# Patient Record
Sex: Male | Born: 1970 | Race: White | Hispanic: No | Marital: Married | Smoking: Never smoker
Health system: Southern US, Community
[De-identification: ages and names within clinical notes are randomized; demographics above are authoritative.]

## PROBLEM LIST (undated history)

## (undated) DIAGNOSIS — R011 Cardiac murmur, unspecified: Secondary | ICD-10-CM

## (undated) DIAGNOSIS — I10 Essential (primary) hypertension: Secondary | ICD-10-CM

## (undated) HISTORY — PX: SHOULDER SURGERY: SHX246

## (undated) HISTORY — DX: Cardiac murmur, unspecified: R01.1

## (undated) HISTORY — PX: ANKLE SURGERY: SHX546

## (undated) HISTORY — PX: OTHER SURGICAL HISTORY: SHX169

## (undated) HISTORY — PX: ULNAR NERVE REPAIR: SHX2594

## (undated) HISTORY — DX: Essential (primary) hypertension: I10

---

## 1997-07-01 ENCOUNTER — Emergency Department (HOSPITAL_COMMUNITY): Admission: EM | Admit: 1997-07-01 | Discharge: 1997-07-01 | Payer: Self-pay | Admitting: Emergency Medicine

## 1997-07-06 ENCOUNTER — Encounter: Admission: RE | Admit: 1997-07-06 | Discharge: 1997-07-06 | Payer: Self-pay | Admitting: *Deleted

## 1997-07-12 ENCOUNTER — Encounter: Admission: RE | Admit: 1997-07-12 | Discharge: 1997-10-10 | Payer: Self-pay | Admitting: Internal Medicine

## 1999-03-24 ENCOUNTER — Encounter: Payer: Self-pay | Admitting: Internal Medicine

## 1999-03-24 ENCOUNTER — Ambulatory Visit (HOSPITAL_COMMUNITY): Admission: RE | Admit: 1999-03-24 | Discharge: 1999-03-24 | Payer: Self-pay | Admitting: Internal Medicine

## 1999-04-03 ENCOUNTER — Encounter: Payer: Self-pay | Admitting: Emergency Medicine

## 1999-04-03 ENCOUNTER — Emergency Department (HOSPITAL_COMMUNITY): Admission: EM | Admit: 1999-04-03 | Discharge: 1999-04-03 | Payer: Self-pay | Admitting: Emergency Medicine

## 2009-12-15 DIAGNOSIS — R739 Hyperglycemia, unspecified: Secondary | ICD-10-CM

## 2009-12-15 DIAGNOSIS — I10 Essential (primary) hypertension: Secondary | ICD-10-CM

## 2009-12-15 DIAGNOSIS — Q2381 Bicuspid aortic valve: Secondary | ICD-10-CM

## 2009-12-15 DIAGNOSIS — E7800 Pure hypercholesterolemia, unspecified: Secondary | ICD-10-CM | POA: Insufficient documentation

## 2009-12-15 DIAGNOSIS — G4733 Obstructive sleep apnea (adult) (pediatric): Secondary | ICD-10-CM

## 2009-12-15 DIAGNOSIS — M25511 Pain in right shoulder: Secondary | ICD-10-CM

## 2009-12-15 DIAGNOSIS — Q231 Congenital insufficiency of aortic valve: Secondary | ICD-10-CM

## 2009-12-15 DIAGNOSIS — M549 Dorsalgia, unspecified: Secondary | ICD-10-CM | POA: Insufficient documentation

## 2009-12-15 DIAGNOSIS — R0602 Shortness of breath: Secondary | ICD-10-CM

## 2009-12-15 DIAGNOSIS — G5620 Lesion of ulnar nerve, unspecified upper limb: Secondary | ICD-10-CM

## 2009-12-15 HISTORY — DX: Bicuspid aortic valve: Q23.81

## 2009-12-15 HISTORY — DX: Obstructive sleep apnea (adult) (pediatric): G47.33

## 2009-12-15 HISTORY — DX: Shortness of breath: R06.02

## 2009-12-15 HISTORY — DX: Congenital insufficiency of aortic valve: Q23.1

## 2009-12-15 HISTORY — DX: Pure hypercholesterolemia, unspecified: E78.00

## 2009-12-15 HISTORY — DX: Hyperglycemia, unspecified: R73.9

## 2009-12-15 HISTORY — DX: Dorsalgia, unspecified: M54.9

## 2009-12-15 HISTORY — DX: Lesion of ulnar nerve, unspecified upper limb: G56.20

## 2009-12-15 HISTORY — DX: Pain in right shoulder: M25.511

## 2009-12-15 HISTORY — DX: Essential (primary) hypertension: I10

## 2009-12-16 DIAGNOSIS — M7702 Medial epicondylitis, left elbow: Secondary | ICD-10-CM

## 2009-12-16 DIAGNOSIS — M25579 Pain in unspecified ankle and joints of unspecified foot: Secondary | ICD-10-CM

## 2009-12-16 DIAGNOSIS — Z0000 Encounter for general adult medical examination without abnormal findings: Secondary | ICD-10-CM | POA: Insufficient documentation

## 2009-12-16 HISTORY — DX: Medial epicondylitis, left elbow: M77.02

## 2009-12-16 HISTORY — DX: Pain in unspecified ankle and joints of unspecified foot: M25.579

## 2009-12-16 HISTORY — DX: Encounter for general adult medical examination without abnormal findings: Z00.00

## 2010-05-04 DIAGNOSIS — E349 Endocrine disorder, unspecified: Secondary | ICD-10-CM

## 2010-05-04 HISTORY — DX: Endocrine disorder, unspecified: E34.9

## 2010-08-21 DIAGNOSIS — Z833 Family history of diabetes mellitus: Secondary | ICD-10-CM

## 2010-08-21 DIAGNOSIS — Z8679 Personal history of other diseases of the circulatory system: Secondary | ICD-10-CM

## 2010-08-21 HISTORY — DX: Personal history of other diseases of the circulatory system: Z86.79

## 2010-08-21 HISTORY — DX: Family history of diabetes mellitus: Z83.3

## 2010-12-12 DIAGNOSIS — M5412 Radiculopathy, cervical region: Secondary | ICD-10-CM | POA: Insufficient documentation

## 2010-12-12 HISTORY — DX: Radiculopathy, cervical region: M54.12

## 2012-02-13 DIAGNOSIS — M519 Unspecified thoracic, thoracolumbar and lumbosacral intervertebral disc disorder: Secondary | ICD-10-CM

## 2012-02-13 HISTORY — DX: Unspecified thoracic, thoracolumbar and lumbosacral intervertebral disc disorder: M51.9

## 2012-09-11 DIAGNOSIS — F3342 Major depressive disorder, recurrent, in full remission: Secondary | ICD-10-CM

## 2012-09-11 HISTORY — DX: Major depressive disorder, recurrent, in full remission: F33.42

## 2013-02-12 DIAGNOSIS — K219 Gastro-esophageal reflux disease without esophagitis: Secondary | ICD-10-CM | POA: Insufficient documentation

## 2013-02-12 DIAGNOSIS — R1310 Dysphagia, unspecified: Secondary | ICD-10-CM

## 2013-02-12 HISTORY — DX: Dysphagia, unspecified: R13.10

## 2013-02-12 HISTORY — DX: Gastro-esophageal reflux disease without esophagitis: K21.9

## 2013-03-20 DIAGNOSIS — K229 Disease of esophagus, unspecified: Secondary | ICD-10-CM | POA: Insufficient documentation

## 2013-03-20 DIAGNOSIS — K2970 Gastritis, unspecified, without bleeding: Secondary | ICD-10-CM | POA: Insufficient documentation

## 2013-03-20 HISTORY — DX: Gastritis, unspecified, without bleeding: K29.70

## 2013-03-20 HISTORY — DX: Disease of esophagus, unspecified: K22.9

## 2015-10-18 DIAGNOSIS — M722 Plantar fascial fibromatosis: Secondary | ICD-10-CM

## 2015-10-18 DIAGNOSIS — B351 Tinea unguium: Secondary | ICD-10-CM | POA: Insufficient documentation

## 2015-10-18 HISTORY — DX: Plantar fascial fibromatosis: M72.2

## 2015-10-18 HISTORY — DX: Tinea unguium: B35.1

## 2015-12-02 DIAGNOSIS — M79671 Pain in right foot: Secondary | ICD-10-CM

## 2015-12-02 HISTORY — DX: Pain in right foot: M79.671

## 2018-08-19 ENCOUNTER — Telehealth: Payer: Self-pay

## 2018-08-19 NOTE — Telephone Encounter (Signed)
Questions for Screening COVID-19  Symptom onset:n/a  Travel or Contacts: no  During this illness, did/does the patient experience any of the following symptoms? Fever >100.4F []  Yes [x]  No []  Unknown Subjective fever (felt feverish) []  Yes [x]  No []  Unknown Chills []  Yes [x]  No []  Unknown Muscle aches (myalgia) []  Yes [x]  No []  Unknown Runny nose (rhinorrhea) []  Yes [x]  No []  Unknown Sore throat []  Yes [x]  No []  Unknown Cough (new onset or worsening of chronic cough) []  Yes [x]  No []  Unknown Shortness of breath (dyspnea) []  Yes [x]  No []  Unknown Nausea or vomiting []  Yes [x]  No []  Unknown Headache []  Yes [x]  No []  Unknown Abdominal pain  []  Yes [x]  No []  Unknown Diarrhea (?3 loose/looser than normal stools/24hr period) []  Yes [x]  No []  Unknown Other, specify:  Patient risk factors: Smoker? []  Current []  Former []  Never If male, currently pregnant? []  Yes []  No  There are no active problems to display for this patient.   Plan:  []  High risk for COVID-19 with red flags go to ED (with CP, SOB, weak/lightheaded, or fever > 101.5). Call ahead.  []  High risk for COVID-19 but stable. Inform provider and coordinate time for WEBEX visit.   []  No red flags but URI signs or symptoms okay for WEBEX visit.  

## 2018-08-20 ENCOUNTER — Ambulatory Visit (INDEPENDENT_AMBULATORY_CARE_PROVIDER_SITE_OTHER): Payer: BC Managed Care – PPO | Admitting: Family Medicine

## 2018-08-20 ENCOUNTER — Telehealth: Payer: Self-pay | Admitting: Gastroenterology

## 2018-08-20 ENCOUNTER — Encounter: Payer: Self-pay | Admitting: Family Medicine

## 2018-08-20 VITALS — BP 140/90 | HR 61 | Temp 98.7°F | Ht 73.0 in | Wt 273.0 lb

## 2018-08-20 DIAGNOSIS — Z1211 Encounter for screening for malignant neoplasm of colon: Secondary | ICD-10-CM

## 2018-08-20 DIAGNOSIS — I10 Essential (primary) hypertension: Secondary | ICD-10-CM

## 2018-08-20 DIAGNOSIS — Z0000 Encounter for general adult medical examination without abnormal findings: Secondary | ICD-10-CM

## 2018-08-20 DIAGNOSIS — Q2381 Bicuspid aortic valve: Secondary | ICD-10-CM

## 2018-08-20 DIAGNOSIS — E785 Hyperlipidemia, unspecified: Secondary | ICD-10-CM

## 2018-08-20 DIAGNOSIS — Q231 Congenital insufficiency of aortic valve: Secondary | ICD-10-CM

## 2018-08-20 DIAGNOSIS — M21961 Unspecified acquired deformity of right lower leg: Secondary | ICD-10-CM

## 2018-08-20 DIAGNOSIS — Z125 Encounter for screening for malignant neoplasm of prostate: Secondary | ICD-10-CM

## 2018-08-20 DIAGNOSIS — Z23 Encounter for immunization: Secondary | ICD-10-CM

## 2018-08-20 LAB — COMPREHENSIVE METABOLIC PANEL
ALT: 20 U/L (ref 0–53)
AST: 25 U/L (ref 0–37)
Albumin: 4.7 g/dL (ref 3.5–5.2)
Alkaline Phosphatase: 55 U/L (ref 39–117)
BUN: 23 mg/dL (ref 6–23)
CO2: 29 mEq/L (ref 19–32)
Calcium: 10.1 mg/dL (ref 8.4–10.5)
Chloride: 103 mEq/L (ref 96–112)
Creatinine, Ser: 1.27 mg/dL (ref 0.40–1.50)
GFR: 60.02 mL/min (ref >60.00)
Glucose, Bld: 98 mg/dL (ref 70–99)
Potassium: 4.8 mEq/L (ref 3.5–5.1)
Sodium: 138 mEq/L (ref 135–145)
Total Bilirubin: 0.5 mg/dL (ref 0.2–1.2)
Total Protein: 7.6 g/dL (ref 6.0–8.3)

## 2018-08-20 LAB — CBC WITH DIFFERENTIAL/PLATELET
Basophils Absolute: 0.1 10*3/uL (ref 0.0–0.1)
Basophils Relative: 1 % (ref 0.0–3.0)
Eosinophils Absolute: 0.2 10*3/uL (ref 0.0–0.7)
Eosinophils Relative: 3.9 % (ref 0.0–5.0)
HCT: 45.2 % (ref 39.0–52.0)
Hemoglobin: 14.9 g/dL (ref 13.0–17.0)
Lymphocytes Relative: 27.1 % (ref 12.0–46.0)
Lymphs Abs: 1.6 10*3/uL (ref 0.7–4.0)
MCHC: 33.1 g/dL (ref 30.0–36.0)
MCV: 89.6 fl (ref 78.0–100.0)
Monocytes Absolute: 0.3 10*3/uL (ref 0.1–1.0)
Monocytes Relative: 5.6 % (ref 3.0–12.0)
Neutro Abs: 3.6 10*3/uL (ref 1.4–7.7)
Neutrophils Relative %: 62.4 % (ref 43.0–77.0)
Platelets: 208 10*3/uL (ref 150.0–400.0)
RBC: 5.04 Mil/uL (ref 4.22–5.81)
RDW: 14.1 % (ref 11.5–15.5)
WBC: 5.8 10*3/uL (ref 4.0–10.5)

## 2018-08-20 LAB — LIPID PANEL
Cholesterol: 197 mg/dL (ref 0–200)
HDL: 48.6 mg/dL (ref >39.00)
LDL Cholesterol: 133 mg/dL — ABNORMAL HIGH (ref 0–99)
NonHDL: 148.78
Total CHOL/HDL Ratio: 4
Triglycerides: 79 mg/dL (ref 0.0–149.0)
VLDL: 15.8 mg/dL (ref 0.0–40.0)

## 2018-08-20 LAB — TSH: TSH: 1.32 u[IU]/mL (ref 0.35–4.50)

## 2018-08-20 LAB — PSA: PSA: 0.57 ng/mL (ref 0.10–4.00)

## 2018-08-20 MED ORDER — ATENOLOL 50 MG PO TABS: 50.0000 mg | ORAL_TABLET | Freq: Two times a day (BID) | ORAL | 1 refills | Status: DC

## 2018-08-20 MED ORDER — LISINOPRIL 20 MG PO TABS: 20.0000 mg | ORAL_TABLET | Freq: Every day | ORAL | 1 refills | Status: DC

## 2018-08-20 MED ORDER — ATENOLOL 50 MG PO TABS: 50.0000 mg | ORAL_TABLET | Freq: Every day | ORAL | 1 refills | Status: DC

## 2018-08-20 NOTE — Telephone Encounter (Signed)
DOD 8/19/202  Dr. Tarri Glenn  We have received a referral for a colonoscopy Pt has GI history in [place]  Records will be sent to you. Please review notes

## 2018-08-20 NOTE — Progress Notes (Signed)
Marcus Diaz - 48 y.o. male MRN [id number]  Date of birth: 05/27/1970  Subjective Chief Complaint  Patient presents with  . Establish Care    est care/ CPE- fasting/ need tdap and flu shot/ needs setting up for colonoscopy   . Medication Refill    atenolol, lisinopril    HPI Marcus Diaz is a 48 y.o. male with history of bicuspid aortic valve, HTN, and HLD here today for initial visit and CPE.  He recently moved the area from [place].  There are previous records from [facility] in [place], [place] available via [facility] that I reviewed today as well.    HTN/Bicuspid AV:  Current management with losartan and atenolol.  He reports he is doing well with current medication.  He denies symptoms of hypotension or bradycardia.  He has not had any increased shortness of breath, fatigue, for pre-syncopal symptoms.  Had Echo in 2018 showing no stenosis and mild thickening.    He also has concern of foot deformity of R foot.  Reports prior reconstruction of foot.  Area is bothersome and painful at times.   He remains very active, typically riding around 26 miles/weekend on his bike.  He does admit that diet could be improved some.  He is a lifelong non-smoker.  He is due for colon cancer screening.   Review of Systems  Constitutional: Negative for chills, fever, malaise/fatigue and weight loss.  HENT: Negative for congestion, ear pain and sore throat.   Eyes: Negative for blurred vision, double vision and pain.  Respiratory: Negative for cough and shortness of breath.   Cardiovascular: Negative for chest pain and palpitations.  Gastrointestinal: Negative for abdominal pain, blood in stool, constipation, heartburn and nausea.  Genitourinary: Negative for dysuria and urgency.  Musculoskeletal: Negative for joint pain and myalgias.  Neurological: Negative for dizziness and headaches.  Endo/Heme/Allergies: Does not bruise/bleed easily.  Psychiatric/Behavioral: Negative for depression. The  patient is not nervous/anxious and does not have insomnia.     No Known Allergies  Past Medical History:  Diagnosis Date  . Heart murmur   . Hypertension     Past Surgical History:  Procedure Laterality Date  . ANKLE SURGERY    . neck fused    . SHOULDER SURGERY    . ULNAR NERVE REPAIR      Social History   Socioeconomic History  . Marital status: Married    Spouse name: Not on file  . Number of children: Not on file  . Years of education: Not on file  . Highest education level: Not on file  Occupational History  . Not on file  Social Needs  . Financial resource strain: Not on file  . Food insecurity    Worry: Not on file    Inability: Not on file  . Transportation needs    Medical: Not on file    Non-medical: Not on file  Tobacco Use  . Smoking status: Never Smoker  . Smokeless tobacco: Never Used  Substance and Sexual Activity  . Alcohol use: Yes    Comment: social  . Drug use: Never  . Sexual activity: Not on file  Lifestyle  . Physical activity    Days per week: Not on file    Minutes per session: Not on file  . Stress: Not on file  Relationships  . Social Herbalist on phone: Not on file    Gets together: Not on file    Attends religious service:  Not on file    Active member of club or organization: Not on file    Attends meetings of clubs or organizations: Not on file    Relationship status: Not on file  Other Topics Concern  . Not on file  Social History Narrative  . Not on file    Family History  Problem Relation Age of Onset  . Lung disease Mother   . Cancer Father        esophageal cancer  . Cancer Maternal Grandfather        colon  . Cancer Paternal Grandmother        stomach    Health Maintenance  Topic Date Due  . HIV Screening  08/09/1983  . TETANUS/TDAP  08/09/1987  . COLONOSCOPY  08/09/2018  . INFLUENZA VACCINE  08/02/2018     ----------------------------------------------------------------------------------------------------------------------------------------------------------------------------------------------------------------- Physical Exam BP 140/90   Pulse 61   Temp 98.7 F (37.1 C) (Temporal)   Ht '6\' 1"'  (1.854 m)   Wt 273 lb (123.8 kg)   SpO2 98%   BMI 36.02 kg/m   Physical Exam Constitutional:      General: He is not in acute distress. HENT:     Head: Normocephalic and atraumatic.     Right Ear: External ear normal.     Left Ear: External ear normal.     Mouth/Throat:     Mouth: Mucous membranes are moist.  Eyes:     General: No scleral icterus. Neck:     Musculoskeletal: Normal range of motion.     Thyroid: No thyromegaly.  Cardiovascular:     Rate and Rhythm: Normal rate and regular rhythm.     Heart sounds: Normal heart sounds.  Pulmonary:     Effort: Pulmonary effort is normal.     Breath sounds: Normal breath sounds.  Abdominal:     General: Bowel sounds are normal. There is no distension.     Palpations: Abdomen is soft.     Tenderness: There is no abdominal tenderness. There is no guarding.  Musculoskeletal:     Comments: Deformity of lateral portion of R foot with overlying callus.   Lymphadenopathy:     Cervical: No cervical adenopathy.  Skin:    General: Skin is warm and dry.     Findings: No rash.  Neurological:     Mental Status: He is alert and oriented to person, place, and time.     Cranial Nerves: No cranial nerve deficit.     Motor: No abnormal muscle tone.  Psychiatric:        Behavior: Behavior normal.     ------------------------------------------------------------------------------------------------------------------------------------------------------------------------------------------------------------------- Assessment and Plan  Bicuspid aortic valve -Stable without symptoms, Echo in 2018 stable.   Essential hypertension -Currently only taking  atenolol qday, will have him increase back to BID.  -Continue lisinopril.   Foot deformity, acquired, right Referral to podiatry  Well adult exam Orders Placed This Encounter  Procedures  . Flu Vaccine QUAD 6+ mos PF IM (Fluarix Quad PF)  . Tdap vaccine greater than or equal to 7yo IM  . Comp Met (CMET)  . CBC w/Diff  . Lipid Profile  . TSH  . PSA  . Ambulatory referral to Gastroenterology    Referral Priority:   Routine    Referral Type:   Consultation    Referral Reason:   Specialty Services Required    Number of Visits Requested:   1  . Ambulatory referral to Podiatry    Referral Priority:   Routine  Referral Type:   Consultation    Referral Reason:   Specialty Services Required    Requested Specialty:   Podiatry    Number of Visits Requested:   1   Screenings: PSA, referral for colonscopy -Immunizations: Tdap and influenza -Anticipatory guidance/Risk factor reduction:  Recommendations per AVS

## 2018-08-20 NOTE — Assessment & Plan Note (Signed)
Referral to podiatry  

## 2018-08-20 NOTE — Assessment & Plan Note (Signed)
-  Stable without symptoms, Echo in 2018 stable.

## 2018-08-20 NOTE — Patient Instructions (Signed)
Preventive Care 40-50 Years Old, Male Preventive care refers to lifestyle choices and visits with your health care provider that can promote health and wellness. This includes:  A yearly physical exam. This is also called an annual well check.  Regular dental and eye exams.  Immunizations.  Screening for certain conditions.  Healthy lifestyle choices, such as eating a healthy diet, getting regular exercise, not using drugs or products that contain nicotine and tobacco, and limiting alcohol use. What can I expect for my preventive care visit? Physical exam Your health care provider will check:  Height and weight. These may be used to calculate body mass index (BMI), which is a measurement that tells if you are at a healthy weight.  Heart rate and blood pressure.  Your skin for abnormal spots. Counseling Your health care provider may ask you questions about:  Alcohol, tobacco, and drug use.  Emotional well-being.  Home and relationship well-being.  Sexual activity.  Eating habits.  Work and work environment. What immunizations do I need?  Influenza (flu) vaccine  This is recommended every year. Tetanus, diphtheria, and pertussis (Tdap) vaccine  You may need a Td booster every 10 years. Varicella (chickenpox) vaccine  You may need this vaccine if you have not already been vaccinated. Zoster (shingles) vaccine  You may need this after age 60. Measles, mumps, and rubella (MMR) vaccine  You may need at least one dose of MMR if you were born in 1957 or later. You may also need a second dose. Pneumococcal conjugate (PCV13) vaccine  You may need this if you have certain conditions and were not previously vaccinated. Pneumococcal polysaccharide (PPSV23) vaccine  You may need one or two doses if you smoke cigarettes or if you have certain conditions. Meningococcal conjugate (MenACWY) vaccine  You may need this if you have certain conditions. Hepatitis A vaccine   You may need this if you have certain conditions or if you travel or work in places where you may be exposed to hepatitis A. Hepatitis B vaccine  You may need this if you have certain conditions or if you travel or work in places where you may be exposed to hepatitis B. Haemophilus influenzae type b (Hib) vaccine  You may need this if you have certain risk factors. Human papillomavirus (HPV) vaccine  If recommended by your health care provider, you may need three doses over 6 months. You may receive vaccines as individual doses or as more than one vaccine together in one shot (combination vaccines). Talk with your health care provider about the risks and benefits of combination vaccines. What tests do I need? Blood tests  Lipid and cholesterol levels. These may be checked every 5 years, or more frequently if you are over 50 years old.  Hepatitis C test.  Hepatitis B test. Screening  Lung cancer screening. You may have this screening every year starting at age 55 if you have a 30-pack-year history of smoking and currently smoke or have quit within the past 15 years.  Prostate cancer screening. Recommendations will vary depending on your family history and other risks.  Colorectal cancer screening. All adults should have this screening starting at age 50 and continuing until age 75. Your health care provider may recommend screening at age 45 if you are at increased risk. You will have tests every 1-10 years, depending on your results and the type of screening test.  Diabetes screening. This is done by checking your blood sugar (glucose) after you have not eaten   for a while (fasting). You may have this done every 1-3 years.  Sexually transmitted disease (STD) testing. Follow these instructions at home: Eating and drinking  Eat a diet that includes fresh fruits and vegetables, whole grains, lean protein, and low-fat dairy products.  Take vitamin and mineral supplements as recommended  by your health care provider.  Do not drink alcohol if your health care provider tells you not to drink.  If you drink alcohol: ? Limit how much you have to 0-2 drinks a day. ? Be aware of how much alcohol is in your drink. In the [place] one drink equals one 12 oz bottle of beer (355 mL), one 5 oz glass of wine (148 mL), or one 1 oz glass of hard liquor (44 mL). Lifestyle  Take daily care of your teeth and gums.  Stay active. Exercise for at least 30 minutes on 5 or more days each week.  Do not use any products that contain nicotine or tobacco, such as cigarettes, e-cigarettes, and chewing tobacco. If you need help quitting, ask your health care provider.  If you are sexually active, practice safe sex. Use a condom or other form of protection to prevent STIs (sexually transmitted infections).  Talk with your health care provider about taking a low-dose aspirin every day starting at age 47. What's next?  Go to your health care provider once a year for a well check visit.  Ask your health care provider how often you should have your eyes and teeth checked.  Stay up to date on all vaccines. This information is not intended to replace advice given to you by your health care provider. Make sure you discuss any questions you have with your health care provider. Document Released: 01/14/2015 Document Revised: 12/12/2017 Document Reviewed: 12/12/2017 Elsevier Patient Education  2020 [employer].

## 2018-08-20 NOTE — Assessment & Plan Note (Signed)
Orders Placed This Encounter  Procedures  . Flu Vaccine QUAD 6+ mos PF IM (Fluarix Quad PF)  . Tdap vaccine greater than or equal to 50yo IM  . Comp Met (CMET)  . CBC w/Diff  . Lipid Profile  . TSH  . PSA  . Ambulatory referral to Gastroenterology    Referral Priority:   Routine    Referral Type:   Consultation    Referral Reason:   Specialty Services Required    Number of Visits Requested:   1  . Ambulatory referral to Podiatry    Referral Priority:   Routine    Referral Type:   Consultation    Referral Reason:   Specialty Services Required    Requested Specialty:   Podiatry    Number of Visits Requested:   1   Screenings: PSA, referral for colonscopy -Immunizations: Tdap and influenza -Anticipatory guidance/Risk factor reduction:  Recommendations per AVS

## 2018-08-20 NOTE — Assessment & Plan Note (Signed)
-  Currently only taking atenolol qday, will have him increase back to BID.  -Continue lisinopril.

## 2018-09-24 ENCOUNTER — Encounter: Payer: BC Managed Care – PPO | Admitting: Gastroenterology

## 2018-10-24 ENCOUNTER — Encounter: Payer: Self-pay | Admitting: Family Medicine

## 2018-11-06 ENCOUNTER — Encounter: Payer: Self-pay | Admitting: Gastroenterology

## 2018-12-17 NOTE — Telephone Encounter (Signed)
Message was left 09/24/18.  Records filed in drawer.

## 2019-04-20 ENCOUNTER — Ambulatory Visit (INDEPENDENT_AMBULATORY_CARE_PROVIDER_SITE_OTHER): Payer: Medicare Other | Admitting: Family Medicine

## 2019-04-20 ENCOUNTER — Encounter: Payer: Self-pay | Admitting: Family Medicine

## 2019-04-20 ENCOUNTER — Other Ambulatory Visit: Payer: Self-pay

## 2019-04-20 VITALS — BP 130/90 | HR 57 | Temp 97.8°F | Ht 73.0 in | Wt 279.0 lb

## 2019-04-20 DIAGNOSIS — I10 Essential (primary) hypertension: Secondary | ICD-10-CM

## 2019-04-20 DIAGNOSIS — T887XXA Unspecified adverse effect of drug or medicament, initial encounter: Secondary | ICD-10-CM

## 2019-04-20 DIAGNOSIS — R001 Bradycardia, unspecified: Secondary | ICD-10-CM

## 2019-04-20 DIAGNOSIS — N5201 Erectile dysfunction due to arterial insufficiency: Secondary | ICD-10-CM

## 2019-04-20 DIAGNOSIS — N529 Male erectile dysfunction, unspecified: Secondary | ICD-10-CM | POA: Insufficient documentation

## 2019-04-20 HISTORY — DX: Erectile dysfunction due to arterial insufficiency: N52.01

## 2019-04-20 MED ORDER — LOSARTAN POTASSIUM 100 MG PO TABS: 100.0000 mg | ORAL_TABLET | Freq: Every day | ORAL | 0 refills | Status: DC

## 2019-04-20 MED ORDER — TADALAFIL 20 MG PO TABS: 10.0000 mg | ORAL_TABLET | ORAL | 11 refills | Status: AC | PRN

## 2019-04-20 MED ORDER — ATENOLOL 50 MG PO TABS: ORAL_TABLET | ORAL | 1 refills | Status: DC

## 2019-04-20 NOTE — Progress Notes (Signed)
Established Patient Office Visit  Subjective:  Patient ID: Marcus Heinecke., male    DOB: [birth year]  Age: 49 y.o. MRN: [id number]  CC:  Chief Complaint  Patient presents with  . Establish Care    Medication refill    HPI Marcus K Mcmahen Jr. presents for follow-up of his hypertension and erectile dysfunction.  Has been on atenolol and lisinopril for years.  Complains of low heart rate and difficulty achieving heart rate when exercising.  He has noticed intermittent cough for the period of time he has been taking the lisinopril.  Complains of intermittent ED.  Is worse after he rides his bike for any length of time but can occur randomly as well.  Past Medical History:  Diagnosis Date  . Heart murmur   . Hypertension     Past Surgical History:  Procedure Laterality Date  . ANKLE SURGERY    . neck fused    . SHOULDER SURGERY    . ULNAR NERVE REPAIR      Family History  Problem Relation Age of Onset  . Lung disease Mother   . Cancer Father        esophageal cancer  . Cancer Maternal Grandfather        colon  . Cancer Paternal Grandmother        stomach    Social History   Socioeconomic History  . Marital status: Married    Spouse name: Not on file  . Number of children: Not on file  . Years of education: Not on file  . Highest education level: Not on file  Occupational History  . Not on file  Tobacco Use  . Smoking status: Never Smoker  . Smokeless tobacco: Never Used  Substance and Sexual Activity  . Alcohol use: Yes    Comment: social  . Drug use: Never  . Sexual activity: Not on file  Other Topics Concern  . Not on file  Social History Narrative  . Not on file   Social Determinants of Health   Financial Resource Strain:   . Difficulty of Paying Living Expenses:   Food Insecurity:   . Worried About Programme researcher, broadcasting/film/video in the Last Year:   . Barista in the Last Year:   Transportation Needs:   . Freight forwarder (Medical):   Marland Kitchen Lack  of Transportation (Non-Medical):   Physical Activity:   . Days of Exercise per Week:   . Minutes of Exercise per Session:   Stress:   . Feeling of Stress :   Social Connections:   . Frequency of Communication with Friends and Family:   . Frequency of Social Gatherings with Friends and Family:   . Attends Religious Services:   . Active Member of Clubs or Organizations:   . Attends Banker Meetings:   Marland Kitchen Marital Status:   Intimate Partner Violence:   . Fear of Current or Ex-Partner:   . Emotionally Abused:   Marland Kitchen Physically Abused:   . Sexually Abused:     Outpatient Medications Prior to Visit  Medication Sig Dispense Refill  . esomeprazole (NEXIUM) 20 MG capsule Take 20 mg by mouth daily at 12 noon.    Marland Kitchen atenolol (TENORMIN) 50 MG tablet Take 1 tablet (50 mg total) by mouth 2 (two) times daily. 180 tablet 1  . lisinopril (ZESTRIL) 20 MG tablet Take 1 tablet (20 mg total) by mouth daily. 90 tablet 1   No facility-administered medications  prior to visit.    No Known Allergies  ROS Review of Systems  Constitutional: Negative.  Negative for diaphoresis, fatigue, fever and unexpected weight change.  HENT: Negative.   Eyes: Negative for photophobia and visual disturbance.  Respiratory: Positive for cough. Negative for shortness of breath and wheezing.   Cardiovascular: Negative.  Negative for chest pain and palpitations.  Gastrointestinal: Negative.   Endocrine: Negative for polyphagia and polyuria.  Genitourinary: Negative.   Musculoskeletal: Negative.   Neurological: Negative.   Psychiatric/Behavioral: Negative.       Objective:    Physical Exam  Constitutional: He is oriented to person, place, and time. He appears well-developed and well-nourished. No distress.  HENT:  Head: Normocephalic and atraumatic.  Right Ear: External ear normal.  Left Ear: External ear normal.  Eyes: Conjunctivae are normal. Right eye exhibits no discharge. Left eye exhibits no  discharge. No scleral icterus.  Neck: No JVD present. No tracheal deviation present. No thyromegaly present.  Cardiovascular: Normal rate, regular rhythm and normal heart sounds.  Pulmonary/Chest: Effort normal and breath sounds normal. No stridor.  Abdominal: Bowel sounds are normal.  Musculoskeletal:        General: No edema.  Neurological: He is alert and oriented to person, place, and time.  Skin: Skin is warm and dry. He is not diaphoretic.  Psychiatric: He has a normal mood and affect. His behavior is normal.    BP 130/90 (BP Location: Left Arm, Patient Position: Sitting, Cuff Size: Large)   Pulse (!) 57   Temp 97.8 F (36.6 C) (Temporal)   Ht 6\' 1"  (1.854 m)   Wt 279 lb (126.6 kg)   SpO2 98%   BMI 36.81 kg/m  Wt Readings from Last 3 Encounters:  04/20/19 279 lb (126.6 kg)  08/20/18 273 lb (123.8 kg)     Health Maintenance Due  Topic Date Due  . HIV Screening  Never done  . COVID-19 Vaccine (1) Never done  . COLONOSCOPY  Never done    There are no preventive care reminders to display for this patient.  Lab Results  Component Value Date   TSH 1.32 08/20/2018   Lab Results  Component Value Date   WBC 5.8 08/20/2018   HGB 14.9 08/20/2018   HCT 45.2 08/20/2018   MCV 89.6 08/20/2018   PLT 208.0 08/20/2018   Lab Results  Component Value Date   NA 138 08/20/2018   K 4.8 08/20/2018   CO2 29 08/20/2018   GLUCOSE 98 08/20/2018   BUN 23 08/20/2018   CREATININE 1.27 08/20/2018   BILITOT 0.5 08/20/2018   ALKPHOS 55 08/20/2018   AST 25 08/20/2018   ALT 20 08/20/2018   PROT 7.6 08/20/2018   ALBUMIN 4.7 08/20/2018   CALCIUM 10.1 08/20/2018   GFR 60.02 08/20/2018   Lab Results  Component Value Date   CHOL 197 08/20/2018   Lab Results  Component Value Date   HDL 48.60 08/20/2018   Lab Results  Component Value Date   LDLCALC 133 (H) 08/20/2018   Lab Results  Component Value Date   TRIG 79.0 08/20/2018   Lab Results  Component Value Date   CHOLHDL  4 08/20/2018   No results found for: HGBA1C    Assessment & Plan:   Problem List Items Addressed This Visit      Cardiovascular and Mediastinum   Essential hypertension - Primary   Relevant Medications   atenolol (TENORMIN) 50 MG tablet   losartan (COZAAR) 100 MG tablet  tadalafil (CIALIS) 20 MG tablet   Erectile dysfunction due to arterial insufficiency   Relevant Medications   atenolol (TENORMIN) 50 MG tablet   losartan (COZAAR) 100 MG tablet   tadalafil (CIALIS) 20 MG tablet     Other   Bradycardia    Other Visit Diagnoses    Medication side effect       Relevant Medications   atenolol (TENORMIN) 50 MG tablet      Meds ordered this encounter  Medications  . atenolol (TENORMIN) 50 MG tablet    Sig: Take one daily for one week and then one every other day for a week and then stop.    Dispense:  14 tablet    Refill:  1    To replace previous rx for once daily  . losartan (COZAAR) 100 MG tablet    Sig: Take 1 tablet (100 mg total) by mouth daily.    Dispense:  90 tablet    Refill:  0  . tadalafil (CIALIS) 20 MG tablet    Sig: Take 0.5-1 tablets (10-20 mg total) by mouth every other day as needed for erectile dysfunction.    Dispense:  5 tablet    Refill:  11    Follow-up: Return in about 5 weeks (around 05/25/2019).   Discontinue lisinopril now.  Start losartan.  Will taper Tenormin over 2 weeks.  Check and record blood pressures and follow-up in 5 weeks. Libby Maw, MD

## 2019-05-25 ENCOUNTER — Encounter: Payer: Self-pay | Admitting: Family Medicine

## 2019-05-25 ENCOUNTER — Other Ambulatory Visit: Payer: Self-pay

## 2019-05-25 ENCOUNTER — Ambulatory Visit: Payer: Medicare Other | Admitting: Family Medicine

## 2019-05-25 VITALS — BP 144/80 | HR 74 | Temp 96.9°F | Ht 73.0 in | Wt 278.4 lb

## 2019-05-25 DIAGNOSIS — H6982 Other specified disorders of Eustachian tube, left ear: Secondary | ICD-10-CM | POA: Diagnosis not present

## 2019-05-25 DIAGNOSIS — I10 Essential (primary) hypertension: Secondary | ICD-10-CM

## 2019-05-25 DIAGNOSIS — H6992 Unspecified Eustachian tube disorder, left ear: Secondary | ICD-10-CM

## 2019-05-25 HISTORY — DX: Unspecified Eustachian tube disorder, left ear: H69.92

## 2019-05-25 HISTORY — DX: Other specified disorders of eustachian tube, left ear: H69.82

## 2019-05-25 LAB — BASIC METABOLIC PANEL
BUN: 19 mg/dL (ref 6–23)
CO2: 30 mEq/L (ref 19–32)
Calcium: 9.7 mg/dL (ref 8.4–10.5)
Chloride: 103 mEq/L (ref 96–112)
Creatinine, Ser: 1.17 mg/dL (ref 0.40–1.50)
GFR: 65.78 mL/min (ref >60.00)
Glucose, Bld: 112 mg/dL — ABNORMAL HIGH (ref 70–99)
Potassium: 4.1 mEq/L (ref 3.5–5.1)
Sodium: 140 mEq/L (ref 135–145)

## 2019-05-25 MED ORDER — MOMETASONE FUROATE 50 MCG/ACT NA SUSP: 2.0000 | Freq: Every day | NASAL | 12 refills | Status: DC

## 2019-05-25 MED ORDER — CHLORTHALIDONE 25 MG PO TABS: 25.0000 mg | ORAL_TABLET | Freq: Every day | ORAL | 0 refills | Status: DC

## 2019-05-25 NOTE — Addendum Note (Signed)
Addended by: Andrez Grime on: 05/25/2019 10:01 AM   Modules accepted: Orders

## 2019-05-25 NOTE — Progress Notes (Addendum)
Established Patient Office Visit  Subjective:  Patient ID: Marcus Diaz., male    DOB: [birth year]  Age: 49 y.o. MRN: [id number]  CC:  Chief Complaint  Patient presents with  . Follow-up    5 week follow up on BP, no concerns. Patient would like left ear checked for wax build up.     HPI [employer]. presents for follow-up of his blood pressure and bradycardia status post discontinuation of Tenormin.  Heart rate is now responding to exercise.  He is no longer bradycardic.  Cough is resolved with discontinue attenuation of the lisinopril.  Left ear is stopped up.  Is unaware of drainage.  Has experienced left ear congestion for years.  Past Medical History:  Diagnosis Date  . Heart murmur   . Hypertension     Past Surgical History:  Procedure Laterality Date  . ANKLE SURGERY    . neck fused    . SHOULDER SURGERY    . ULNAR NERVE REPAIR      Family History  Problem Relation Age of Onset  . Lung disease Mother   . Cancer Father        esophageal cancer  . Cancer Maternal Grandfather        colon  . Cancer Paternal Grandmother        stomach    Social History   Socioeconomic History  . Marital status: Married    Spouse name: Not on file  . Number of children: Not on file  . Years of education: Not on file  . Highest education level: Not on file  Occupational History  . Not on file  Tobacco Use  . Smoking status: Never Smoker  . Smokeless tobacco: Never Used  Substance and Sexual Activity  . Alcohol use: Yes    Comment: social  . Drug use: Never  . Sexual activity: Not on file  Other Topics Concern  . Not on file  Social History Narrative  . Not on file   Social Determinants of Health   Financial Resource Strain:   . Difficulty of Paying Living Expenses:   Food Insecurity:   . Worried About Charity fundraiser in the Last Year:   . Arboriculturist in the Last Year:   Transportation Needs:   . Film/video editor (Medical):   Marland Kitchen Lack of  Transportation (Non-Medical):   Physical Activity:   . Days of Exercise per Week:   . Minutes of Exercise per Session:   Stress:   . Feeling of Stress :   Social Connections:   . Frequency of Communication with Friends and Family:   . Frequency of Social Gatherings with Friends and Family:   . Attends Religious Services:   . Active Member of Clubs or Organizations:   . Attends Archivist Meetings:   Marland Kitchen Marital Status:   Intimate Partner Violence:   . Fear of Current or Ex-Partner:   . Emotionally Abused:   Marland Kitchen Physically Abused:   . Sexually Abused:     Outpatient Medications Prior to Visit  Medication Sig Dispense Refill  . esomeprazole (NEXIUM) 20 MG capsule Take 20 mg by mouth daily at 12 noon.    Marland Kitchen losartan (COZAAR) 100 MG tablet Take 1 tablet (100 mg total) by mouth daily. 90 tablet 0  . tadalafil (CIALIS) 20 MG tablet Take 0.5-1 tablets (10-20 mg total) by mouth every other day as needed for erectile dysfunction. 5 tablet 11  .  atenolol (TENORMIN) 50 MG tablet Take one daily for one week and then one every other day for a week and then stop. (Patient not taking: Reported on 05/25/2019) 14 tablet 1   No facility-administered medications prior to visit.    No Known Allergies  ROS Review of Systems  Constitutional: Negative.   HENT: Positive for hearing loss.   Eyes: Negative for photophobia.  Respiratory: Negative for cough, choking and shortness of breath.   Cardiovascular: Negative.  Negative for palpitations.  Gastrointestinal: Negative.   Genitourinary: Negative.   Musculoskeletal: Negative for gait problem and joint swelling.  Allergic/Immunologic: Negative for immunocompromised state.  Neurological: Negative.   Hematological: Does not bruise/bleed easily.  Psychiatric/Behavioral: Negative.       Objective:    Physical Exam  Constitutional: He is oriented to person, place, and time. He appears well-developed and well-nourished. No distress.  HENT:    Head: Normocephalic and atraumatic.  Right Ear: External ear normal.  Left Ear: External ear normal.  Mouth/Throat: Oropharynx is clear and moist. No oropharyngeal exudate.  Eyes: Pupils are equal, round, and reactive to light. Conjunctivae are normal. Right eye exhibits no discharge. Left eye exhibits no discharge.  Neck: No JVD present. No tracheal deviation present. No thyromegaly present.  Cardiovascular: Normal rate, regular rhythm and normal heart sounds.  Pulmonary/Chest: Effort normal and breath sounds normal. No stridor.  Abdominal: Bowel sounds are normal.  Musculoskeletal:        General: No edema.  Lymphadenopathy:    He has no cervical adenopathy.  Neurological: He is alert and oriented to person, place, and time.  Skin: Skin is warm and dry. He is not diaphoretic.  Psychiatric: He has a normal mood and affect. His behavior is normal.    BP (!) 144/80   Pulse 74   Temp (!) 96.9 F (36.1 C) (Tympanic)   Ht 6\' 1"  (1.854 m)   Wt 278 lb 6.4 oz (126.3 kg)   SpO2 94%   BMI 36.73 kg/m  Wt Readings from Last 3 Encounters:  05/25/19 278 lb 6.4 oz (126.3 kg)  04/20/19 279 lb (126.6 kg)  08/20/18 273 lb (123.8 kg)     Health Maintenance Due  Topic Date Due  . HIV Screening  Never done  . COLONOSCOPY  Never done    There are no preventive care reminders to display for this patient.  Lab Results  Component Value Date   TSH 1.32 08/20/2018   Lab Results  Component Value Date   WBC 5.8 08/20/2018   HGB 14.9 08/20/2018   HCT 45.2 08/20/2018   MCV 89.6 08/20/2018   PLT 208.0 08/20/2018   Lab Results  Component Value Date   NA 138 08/20/2018   K 4.8 08/20/2018   CO2 29 08/20/2018   GLUCOSE 98 08/20/2018   BUN 23 08/20/2018   CREATININE 1.27 08/20/2018   BILITOT 0.5 08/20/2018   ALKPHOS 55 08/20/2018   AST 25 08/20/2018   ALT 20 08/20/2018   PROT 7.6 08/20/2018   ALBUMIN 4.7 08/20/2018   CALCIUM 10.1 08/20/2018   GFR 60.02 08/20/2018   Lab Results   Component Value Date   CHOL 197 08/20/2018   Lab Results  Component Value Date   HDL 48.60 08/20/2018   Lab Results  Component Value Date   LDLCALC 133 (H) 08/20/2018   Lab Results  Component Value Date   TRIG 79.0 08/20/2018   Lab Results  Component Value Date   CHOLHDL 4 08/20/2018  No results found for: HGBA1C    Assessment & Plan:   Problem List Items Addressed This Visit      Cardiovascular and Mediastinum   Essential hypertension - Primary   Relevant Medications   chlorthalidone (HYGROTON) 25 MG tablet   Other Relevant Orders   Basic metabolic panel     Nervous and Auditory   Dysfunction of left eustachian tube   Relevant Medications   mometasone (NASONEX) 50 MCG/ACT nasal spray      Meds ordered this encounter  Medications  . chlorthalidone (HYGROTON) 25 MG tablet    Sig: Take 1 tablet (25 mg total) by mouth daily.    Dispense:  90 tablet    Refill:  0  . mometasone (NASONEX) 50 MCG/ACT nasal spray    Sig: Place 2 sprays into the nose daily.    Dispense:  17 g    Refill:  12    Follow-up: Return in about 1 month (around 06/25/2019).   Have added chlorthalidone.  Warned of initial ED issues.  Will try Nasonex for his left ear congestion.  May need ENT referral. Mliss Sax, MD

## 2019-06-24 ENCOUNTER — Other Ambulatory Visit: Payer: Self-pay

## 2019-06-25 ENCOUNTER — Encounter: Payer: Self-pay | Admitting: Family Medicine

## 2019-06-25 ENCOUNTER — Ambulatory Visit: Payer: Medicare Other | Admitting: Family Medicine

## 2019-06-25 VITALS — BP 146/78 | HR 78 | Temp 98.0°F | Ht 73.0 in | Wt 270.6 lb

## 2019-06-25 DIAGNOSIS — H6982 Other specified disorders of Eustachian tube, left ear: Secondary | ICD-10-CM | POA: Diagnosis not present

## 2019-06-25 DIAGNOSIS — L639 Alopecia areata, unspecified: Secondary | ICD-10-CM

## 2019-06-25 DIAGNOSIS — Q231 Congenital insufficiency of aortic valve: Secondary | ICD-10-CM | POA: Diagnosis not present

## 2019-06-25 DIAGNOSIS — Z0000 Encounter for general adult medical examination without abnormal findings: Secondary | ICD-10-CM

## 2019-06-25 DIAGNOSIS — I10 Essential (primary) hypertension: Secondary | ICD-10-CM | POA: Diagnosis not present

## 2019-06-25 HISTORY — DX: Morbid (severe) obesity due to excess calories: E66.01

## 2019-06-25 LAB — URINALYSIS, ROUTINE W REFLEX MICROSCOPIC
Bilirubin Urine: NEGATIVE
Hgb urine dipstick: NEGATIVE
Ketones, ur: NEGATIVE
Leukocytes,Ua: NEGATIVE
Nitrite: NEGATIVE
RBC / HPF: NONE SEEN (ref 0–?)
Specific Gravity, Urine: 1.015 (ref 1.000–1.030)
Total Protein, Urine: NEGATIVE
Urine Glucose: NEGATIVE
Urobilinogen, UA: 0.2 (ref 0.0–1.0)
WBC, UA: NONE SEEN (ref 0–?)
pH: 7.5 (ref 5.0–8.0)

## 2019-06-25 LAB — COMPREHENSIVE METABOLIC PANEL
ALT: 47 U/L (ref 0–53)
AST: 40 U/L — ABNORMAL HIGH (ref 0–37)
Albumin: 4.6 g/dL (ref 3.5–5.2)
Alkaline Phosphatase: 59 U/L (ref 39–117)
BUN: 20 mg/dL (ref 6–23)
CO2: 32 mEq/L (ref 19–32)
Calcium: 10 mg/dL (ref 8.4–10.5)
Chloride: 98 mEq/L (ref 96–112)
Creatinine, Ser: 1.16 mg/dL (ref 0.40–1.50)
GFR: 66.41 mL/min (ref >60.00)
Glucose, Bld: 98 mg/dL (ref 70–99)
Potassium: 4.2 mEq/L (ref 3.5–5.1)
Sodium: 137 mEq/L (ref 135–145)
Total Bilirubin: 0.5 mg/dL (ref 0.2–1.2)
Total Protein: 7.4 g/dL (ref 6.0–8.3)

## 2019-06-25 LAB — LDL CHOLESTEROL, DIRECT: Direct LDL: 145 mg/dL

## 2019-06-25 LAB — CBC
HCT: 45 % (ref 39.0–52.0)
Hemoglobin: 15.1 g/dL (ref 13.0–17.0)
MCHC: 33.6 g/dL (ref 30.0–36.0)
MCV: 88.5 fl (ref 78.0–100.0)
Platelets: 230 10*3/uL (ref 150.0–400.0)
RBC: 5.08 Mil/uL (ref 4.22–5.81)
RDW: 13.5 % (ref 11.5–15.5)
WBC: 5.8 10*3/uL (ref 4.0–10.5)

## 2019-06-25 LAB — PSA: PSA: 0.52 ng/mL (ref 0.10–4.00)

## 2019-06-25 MED ORDER — LOSARTAN POTASSIUM 100 MG PO TABS: 100.0000 mg | ORAL_TABLET | Freq: Every day | ORAL | 0 refills | Status: DC

## 2019-06-25 MED ORDER — AMLODIPINE BESYLATE 5 MG PO TABS: 5.0000 mg | ORAL_TABLET | Freq: Every day | ORAL | 3 refills | Status: AC

## 2019-06-25 MED ORDER — CHLORTHALIDONE 25 MG PO TABS: 25.0000 mg | ORAL_TABLET | Freq: Every day | ORAL | 0 refills | Status: DC

## 2019-06-25 NOTE — Progress Notes (Signed)
Established Patient Office Visit  Subjective:  Patient ID: Marcus Diaz., male    DOB: [birth year]  Age: 49 y.o. MRN: [id number]  CC:  Chief Complaint  Patient presents with  . Follow-up    1 month follow up on BP, no concerns    HPI Marcus Diaz. presents for follow-up of blood pressure.  Is tapered off of Tenormin.  Bradycardia has resolved.  Having no issues taking chlorthalidone or losartan.  Blood pressure remains elevated.  Nonfasting this morning.  Needs follow-up for bicuspid aortic valve.  Left ear congestion persist despite treatment with Nasonex.  Patient has several moles that need to be checked.  Is time for colonoscopy.  Having no issues at all with urine flow.  Has started exercising again.  Past Medical History:  Diagnosis Date  . Heart murmur   . Hypertension     Past Surgical History:  Procedure Laterality Date  . ANKLE SURGERY    . neck fused    . SHOULDER SURGERY    . ULNAR NERVE REPAIR      Family History  Problem Relation Age of Onset  . Lung disease Mother   . Cancer Father        esophageal cancer  . Cancer Maternal Grandfather        colon  . Cancer Paternal Grandmother        stomach    Social History   Socioeconomic History  . Marital status: Married    Spouse name: Not on file  . Number of children: Not on file  . Years of education: Not on file  . Highest education level: Not on file  Occupational History  . Not on file  Tobacco Use  . Smoking status: Never Smoker  . Smokeless tobacco: Never Used  Substance and Sexual Activity  . Alcohol use: Yes    Comment: social  . Drug use: Never  . Sexual activity: Not on file  Other Topics Concern  . Not on file  Social History Narrative  . Not on file   Social Determinants of Health   Financial Resource Strain:   . Difficulty of Paying Living Expenses:   Food Insecurity:   . Worried About Charity fundraiser in the Last Year:   . Arboriculturist in the Last Year:     Transportation Needs:   . Film/video editor (Medical):   Marland Kitchen Lack of Transportation (Non-Medical):   Physical Activity:   . Days of Exercise per Week:   . Minutes of Exercise per Session:   Stress:   . Feeling of Stress :   Social Connections:   . Frequency of Communication with Friends and Family:   . Frequency of Social Gatherings with Friends and Family:   . Attends Religious Services:   . Active Member of Clubs or Organizations:   . Attends Archivist Meetings:   Marland Kitchen Marital Status:   Intimate Partner Violence:   . Fear of Current or Ex-Partner:   . Emotionally Abused:   Marland Kitchen Physically Abused:   . Sexually Abused:     Outpatient Medications Prior to Visit  Medication Sig Dispense Refill  . esomeprazole (NEXIUM) 20 MG capsule Take 20 mg by mouth daily at 12 noon.    . mometasone (NASONEX) 50 MCG/ACT nasal spray Place 2 sprays into the nose daily. 17 g 12  . tadalafil (CIALIS) 20 MG tablet Take 0.5-1 tablets (10-20 mg total) by mouth every  other day as needed for erectile dysfunction. 5 tablet 11  . chlorthalidone (HYGROTON) 25 MG tablet Take 1 tablet (25 mg total) by mouth daily. 90 tablet 0  . losartan (COZAAR) 100 MG tablet Take 1 tablet (100 mg total) by mouth daily. 90 tablet 0  . atenolol (TENORMIN) 50 MG tablet Take one daily for one week and then one every other day for a week and then stop. (Patient not taking: Reported on 05/25/2019) 14 tablet 1   No facility-administered medications prior to visit.    No Known Allergies  ROS Review of Systems  Constitutional: Negative.   HENT: Negative.   Eyes: Negative for photophobia and visual disturbance.  Respiratory: Negative.   Cardiovascular: Negative.   Gastrointestinal: Negative.   Endocrine: Negative for polyphagia and polyuria.  Genitourinary: Negative.   Musculoskeletal: Negative for gait problem and joint swelling.  Skin: Negative for pallor and rash.  Allergic/Immunologic: Negative for  immunocompromised state.  Neurological: Negative for light-headedness and numbness.  Hematological: Does not bruise/bleed easily.  Psychiatric/Behavioral: Negative.       Objective:    Physical Exam Vitals reviewed.  Constitutional:      General: He is not in acute distress.    Appearance: Normal appearance. He is obese. He is not ill-appearing, toxic-appearing or diaphoretic.  HENT:     Head: Normocephalic and atraumatic.     Right Ear: Tympanic membrane, ear canal and external ear normal.     Left Ear: Ear canal and external ear normal.     Nose: No congestion or rhinorrhea.     Mouth/Throat:     Mouth: Mucous membranes are moist.     Pharynx: Oropharynx is clear. No oropharyngeal exudate or posterior oropharyngeal erythema.  Eyes:     General: No scleral icterus.       Right eye: No discharge.        Left eye: No discharge.     Extraocular Movements: Extraocular movements intact.     Conjunctiva/sclera: Conjunctivae normal.     Pupils: Pupils are equal, round, and reactive to light.  Cardiovascular:     Rate and Rhythm: Normal rate and regular rhythm.  Pulmonary:     Effort: Pulmonary effort is normal.     Breath sounds: Normal breath sounds.  Abdominal:     General: Abdomen is flat. Bowel sounds are normal. There is no distension.     Palpations: Abdomen is soft. There is no mass.     Tenderness: There is no abdominal tenderness. There is no guarding or rebound.     Hernia: No hernia is present. There is no hernia in the left inguinal area or right inguinal area.  Genitourinary:    Penis: Normal and circumcised. No hypospadias, erythema, tenderness, discharge, swelling or lesions.      Testes:        Right: Mass, tenderness or swelling not present. Right testis is descended.        Left: Mass, tenderness or swelling not present. Left testis is descended.     Epididymis:     Right: Not inflamed or enlarged.     Left: Not inflamed or enlarged.     Prostate: Not  enlarged, not tender and no nodules present.     Rectum: Guaiac result negative. No mass, tenderness, anal fissure, external hemorrhoid or internal hemorrhoid. Normal anal tone.  Musculoskeletal:     Cervical back: No rigidity or tenderness.     Right lower leg: No edema.  Left lower leg: No edema.  Lymphadenopathy:     Cervical: No cervical adenopathy.     Lower Body: No right inguinal adenopathy. No left inguinal adenopathy.  Skin:    General: Skin is warm and dry.  Neurological:     Mental Status: He is alert and oriented to person, place, and time.  Psychiatric:        Mood and Affect: Mood normal.        Behavior: Behavior normal.     BP (!) 146/78   Pulse 78   Temp 98 F (36.7 C) (Tympanic)   Ht 6\' 1"  (1.854 m)   Wt 270 lb 9.6 oz (122.7 kg)   SpO2 95%   BMI 35.70 kg/m  Wt Readings from Last 3 Encounters:  06/25/19 270 lb 9.6 oz (122.7 kg)  05/25/19 278 lb 6.4 oz (126.3 kg)  04/20/19 279 lb (126.6 kg)     Health Maintenance Due  Topic Date Due  . Hepatitis C Screening  Never done  . HIV Screening  Never done  . COLONOSCOPY  Never done    There are no preventive care reminders to display for this patient.  Lab Results  Component Value Date   TSH 1.32 08/20/2018   Lab Results  Component Value Date   WBC 5.8 08/20/2018   HGB 14.9 08/20/2018   HCT 45.2 08/20/2018   MCV 89.6 08/20/2018   PLT 208.0 08/20/2018   Lab Results  Component Value Date   NA 140 05/25/2019   K 4.1 05/25/2019   CO2 30 05/25/2019   GLUCOSE 112 (H) 05/25/2019   BUN 19 05/25/2019   CREATININE 1.17 05/25/2019   BILITOT 0.5 08/20/2018   ALKPHOS 55 08/20/2018   AST 25 08/20/2018   ALT 20 08/20/2018   PROT 7.6 08/20/2018   ALBUMIN 4.7 08/20/2018   CALCIUM 9.7 05/25/2019   GFR 65.78 05/25/2019   Lab Results  Component Value Date   CHOL 197 08/20/2018   Lab Results  Component Value Date   HDL 48.60 08/20/2018   Lab Results  Component Value Date   LDLCALC 133 (H)  08/20/2018   Lab Results  Component Value Date   TRIG 79.0 08/20/2018   Lab Results  Component Value Date   CHOLHDL 4 08/20/2018   No results found for: HGBA1C    Assessment & Plan:   Problem List Items Addressed This Visit      Cardiovascular and Mediastinum   Bicuspid aortic valve   Relevant Medications   chlorthalidone (HYGROTON) 25 MG tablet   losartan (COZAAR) 100 MG tablet   amLODipine (NORVASC) 5 MG tablet   Other Relevant Orders   Ambulatory referral to Cardiology   Essential hypertension - Primary   Relevant Medications   chlorthalidone (HYGROTON) 25 MG tablet   losartan (COZAAR) 100 MG tablet   amLODipine (NORVASC) 5 MG tablet   Other Relevant Orders   Comprehensive metabolic panel     Nervous and Auditory   Dysfunction of left eustachian tube   Relevant Medications   amLODipine (NORVASC) 5 MG tablet   Other Relevant Orders   Ambulatory referral to ENT     Other   Healthcare maintenance   Relevant Orders   CBC   Comprehensive metabolic panel   LDL cholesterol, direct   Urinalysis, Routine w reflex microscopic   PSA   Ambulatory referral to Gastroenterology   Morbid obesity (HCC)    Other Visit Diagnoses    Alopecia areata  Relevant Orders   Ambulatory referral to Dermatology      Meds ordered this encounter  Medications  . chlorthalidone (HYGROTON) 25 MG tablet    Sig: Take 1 tablet (25 mg total) by mouth daily.    Dispense:  90 tablet    Refill:  0  . losartan (COZAAR) 100 MG tablet    Sig: Take 1 tablet (100 mg total) by mouth daily.    Dispense:  90 tablet    Refill:  0  . amLODipine (NORVASC) 5 MG tablet    Sig: Take 1 tablet (5 mg total) by mouth daily.    Dispense:  90 tablet    Refill:  3    Follow-up: Return in about 6 weeks (around 08/06/2019).  Given information on health maintenance and disease prevention.  Given information on BMI and calorie counting to lose weight.  Cardiology referral for follow-up of bicuspid  aortic valve.  Will check through dermatology.  Follow-up with ENT for dysfunctional eustachian tube.  Have added Norvasc to losartan and chlorthalidone  Mliss Sax, MD

## 2019-06-25 NOTE — Patient Instructions (Signed)
Alopecia Areata, Adult  Alopecia areata is a condition that causes you to lose hair. You may lose hair on your scalp in patches. In some cases, you may lose all the hair on your scalp (alopecia totalis) or all the hair from your face and body (alopecia universalis). Alopecia areata is an autoimmune disease. This means that your body's defense system (immune system) mistakes normal parts of the body for germs or other things that can make you sick. When you have alopecia areata, the immune system attacks the hair follicles. Alopecia areata usually develops in childhood, but it can develop at any age. For some people, their hair grows back on its own and hair loss does not happen again. For others, their hair may fall out and grow back in cycles. The hair loss may last many years. Having this condition can be emotionally difficult, but it is not dangerous. What are the causes? The cause of this condition is not known. What increases the risk? This condition is more likely to develop in people who have:  A family history of alopecia.  A family history of another autoimmune disease, including type 1 diabetes and rheumatoid arthritis.  Asthma and allergies.  Down syndrome. What are the signs or symptoms? Round spots of patchy hair loss on the scalp is the main symptom of this condition. The spots may be mildly itchy. Other symptoms include:  Short dark hairs in the bald patches that are wider at the top (exclamation point hairs).  Dents, white spots, or lines in the fingernails or toenails.  Balding and body hair loss. This is rare. How is this diagnosed? This condition is diagnosed based on your symptoms and family history. Your health care provider will also check your scalp skin, teeth, and nails. Your health care provider may refer you to a specialist in hair and skin disorders (dermatologist). You may also have tests, including:  A hair pull test.  Blood tests or other screening tests  to check for autoimmune diseases, such as thyroid disease or diabetes.  Skin biopsy to confirm the diagnosis.  A procedure to examine the skin with a lighted magnifying instrument (dermoscopy). How is this treated? There is no cure for alopecia areata. Treatment is aimed at promoting the regrowth of hair and preventing the immune system from overreacting. No single treatment is right for all people with alopecia areata. It depends on the type of hair loss you have and how severe it is. Work with your health care provider to find the best treatment for you. Treatment may include:  Having regular checkups to make sure the condition is not getting worse (watchful waiting).  Steroid creams or pills for 6-8 weeks to stop the immune reaction and help hair to regrow more quickly.  Other topical medicines to alter the immune system response and support the hair growth cycle.  Steroid injections.  Therapy and counseling with a support group or therapist if you are having trouble coping with hair loss. Follow these instructions at home:  Learn as much as you can about your condition.  Apply topical creams only as told by your health care provider.  Take over-the-counter and prescription medicines only as told by your health care provider.  Consider getting a wig or products to make hair look fuller or to cover bald spots, if you feel uncomfortable with your appearance.  Get therapy or counseling if you are having a hard time coping with hair loss. Ask your health care provider to recommend  a counselor or support group.  Keep all follow-up visits as told by your health care provider. This is important. Contact a health care provider if:  Your hair loss gets worse, even with treatment.  You have new symptoms.  You are struggling emotionally. Summary  Alopecia areata is an autoimmune condition that makes your body's defense system (immune system) attack the hair follicles. This causes you  to lose hair.  Treatments may include regular checkups to make sure that the condition is not getting worse (watchful waiting), medicines, and steroid injections. This information is not intended to replace advice given to you by your health care provider. Make sure you discuss any questions you have with your health care provider. Document Revised: 11/30/2016 Document Reviewed: 01/06/2016 Elsevier Patient Education  2020 [place].  BMI for Adults What is BMI? Body mass index (BMI) is a number that is calculated from a person's weight and height. BMI can help estimate how much of a person's weight is composed of fat. BMI does not measure body fat directly. Rather, it is an alternative to procedures that directly measure body fat, which can be difficult and expensive. BMI can help identify people who may be at higher risk for certain medical problems. What are BMI measurements used for? BMI is used as a screening tool to identify possible weight problems. It helps determine whether a person is obese, overweight, a healthy weight, or underweight. BMI is useful for:  Identifying a weight problem that may be related to a medical condition or may increase the risk for medical problems.  Promoting changes, such as changes in diet and exercise, to help reach a healthy weight. BMI screening can be repeated to see if these changes are working. How is BMI calculated? BMI involves measuring your weight in relation to your height. Both height and weight are measured, and the BMI is calculated from those numbers. This can be done either in [place] ([place] or metric measurements. Note that charts and online BMI calculators are available to help you find your BMI quickly and easily without having to do these calculations yourself. To calculate your BMI in [place] ([place] measurements:  1. Measure your weight in pounds (lb). 2. Multiply the number of pounds by 703. ? For example, for a person who weighs  180 lb, multiply that number by 703, which equals 126,540. 3. Measure your height in inches. Then multiply that number by itself to get a measurement called "inches squared." ? For example, for a person who is 70 inches tall, the "inches squared" measurement is 70 inches x 70 inches, which equals 4,900 inches squared. 4. Divide the total from step 2 (number of lb x 703) by the total from step 3 (inches squared): 126,540  4,900 = 25.8. This is your BMI. To calculate your BMI in metric measurements: 1. Measure your weight in kilograms (kg). 2. Measure your height in meters (m). Then multiply that number by itself to get a measurement called "meters squared." ? For example, for a person who is 1.75 m tall, the "meters squared" measurement is 1.75 m x 1.75 m, which is equal to 3.1 meters squared. 3. Divide the number of kilograms (your weight) by the meters squared number. In this example: 70  3.1 = 22.6. This is your BMI. What do the results mean? BMI charts are used to identify whether you are underweight, normal weight, overweight, or obese. The following guidelines will be used:  Underweight: BMI less than 18.5.  Normal  weight: BMI between 18.5 and 24.9.  Overweight: BMI between 25 and 29.9.  Obese: BMI of 30 or above. Keep these notes in mind:  Weight includes both fat and muscle, so someone with a muscular build, such as an athlete, may have a BMI that is higher than 24.9. In cases like these, BMI is not an accurate measure of body fat.  To determine if excess body fat is the cause of a BMI of 25 or higher, further assessments may need to be done by a health care provider.  BMI is usually interpreted in the same way for men and women. Where to find more information For more information about BMI, including tools to quickly calculate your BMI, go to these websites:  Centers for Disease Control and Prevention: http://www.wolf.info/  American Heart Association: www.heart.org  National Heart,  Lung, and Blood Institute: https://wilson-eaton.com/ Summary  Body mass index (BMI) is a number that is calculated from a person's weight and height.  BMI may help estimate how much of a person's weight is composed of fat. BMI can help identify those who may be at higher risk for certain medical problems.  BMI can be measured using English measurements or metric measurements.  BMI charts are used to identify whether you are underweight, normal weight, overweight, or obese. This information is not intended to replace advice given to you by your health care provider. Make sure you discuss any questions you have with your health care provider. Document Revised: 09/10/2018 Document Reviewed: 07/18/2018 Elsevier Patient Education  [place] Maintenance, Male Adopting a healthy lifestyle and getting preventive care are important in promoting health and wellness. Ask your health care provider about:  The right schedule for you to have regular tests and exams.  Things you can do on your own to prevent diseases and keep yourself healthy. What should I know about diet, weight, and exercise? Eat a healthy diet   Eat a diet that includes plenty of vegetables, fruits, low-fat dairy products, and lean protein.  Do not eat a lot of foods that are high in solid fats, added sugars, or sodium. Maintain a healthy weight Body mass index (BMI) is a measurement that can be used to identify possible weight problems. It estimates body fat based on height and weight. Your health care provider can help determine your BMI and help you achieve or maintain a healthy weight. Get regular exercise Get regular exercise. This is one of the most important things you can do for your health. Most adults should:  Exercise for at least 150 minutes each week. The exercise should increase your heart rate and make you sweat (moderate-intensity exercise).  Do strengthening exercises at least twice a week. This is  in addition to the moderate-intensity exercise.  Spend less time sitting. Even light physical activity can be beneficial. Watch cholesterol and blood lipids Have your blood tested for lipids and cholesterol at 51 years of age, then have this test every 5 years. You may need to have your cholesterol levels checked more often if:  Your lipid or cholesterol levels are high.  You are older than 51 years of age.  You are at high risk for heart disease. What should I know about cancer screening? Many types of cancers can be detected early and may often be prevented. Depending on your health history and family history, you may need to have cancer screening at various ages. This may include screening for:  Colorectal cancer.  Prostate cancer.  Skin cancer.  Lung cancer. What should I know about heart disease, diabetes, and high blood pressure? Blood pressure and heart disease  High blood pressure causes heart disease and increases the risk of stroke. This is more likely to develop in people who have high blood pressure readings, are of African descent, or are overweight.  Talk with your health care provider about your target blood pressure readings.  Have your blood pressure checked: ? Every 3-5 years if you are 71-38 years of age. ? Every year if you are 63 years old or older.  If you are between the ages of 70 and 41 and are a current or former smoker, ask your health care provider if you should have a one-time screening for abdominal aortic aneurysm (AAA). Diabetes Have regular diabetes screenings. This checks your fasting blood sugar level. Have the screening done:  Once every three years after age 69 if you are at a normal weight and have a low risk for diabetes.  More often and at a younger age if you are overweight or have a high risk for diabetes. What should I know about preventing infection? Hepatitis B If you have a higher risk for hepatitis B, you should be screened for  this virus. Talk with your health care provider to find out if you are at risk for hepatitis B infection. Hepatitis C Blood testing is recommended for:  Everyone born from [birth year] through 1967.  Anyone with known risk factors for hepatitis C. Sexually transmitted infections (STIs)  You should be screened each year for STIs, including gonorrhea and chlamydia, if: ? You are sexually active and are younger than 51 years of age. ? You are older than 51 years of age and your health care provider tells you that you are at risk for this type of infection. ? Your sexual activity has changed since you were last screened, and you are at increased risk for chlamydia or gonorrhea. Ask your health care provider if you are at risk.  Ask your health care provider about whether you are at high risk for HIV. Your health care provider may recommend a prescription medicine to help prevent HIV infection. If you choose to take medicine to prevent HIV, you should first get tested for HIV. You should then be tested every 3 months for as long as you are taking the medicine. Follow these instructions at home: Lifestyle  Do not use any products that contain nicotine or tobacco, such as cigarettes, e-cigarettes, and chewing tobacco. If you need help quitting, ask your health care provider.  Do not use street drugs.  Do not share needles.  Ask your health care provider for help if you need support or information about quitting drugs. Alcohol use  Do not drink alcohol if your health care provider tells you not to drink.  If you drink alcohol: ? Limit how much you have to 0-2 drinks a day. ? Be aware of how much alcohol is in your drink. In the [place] one drink equals one 12 oz bottle of beer (355 mL), one 5 oz glass of wine (148 mL), or one 1 oz glass of hard liquor (44 mL). General instructions  Schedule regular health, dental, and eye exams.  Stay current with your vaccines.  Tell your health care provider  if: ? You often feel depressed. ? You have ever been abused or do not feel safe at home. Summary  Adopting a healthy lifestyle and getting preventive care are important in promoting  health and wellness.  Follow your health care provider's instructions about healthy diet, exercising, and getting tested or screened for diseases.  Follow your health care provider's instructions on monitoring your cholesterol and blood pressure. This information is not intended to replace advice given to you by your health care provider. Make sure you discuss any questions you have with your health care provider. Document Revised: 12/11/2017 Document Reviewed: 12/11/2017 Elsevier Patient Education  2020 Elsevier Inc.  Preventive Care 8-47 Years Old, Male Preventive care refers to lifestyle choices and visits with your health care provider that can promote health and wellness. This includes:  A yearly physical exam. This is also called an annual well check.  Regular dental and eye exams.  Immunizations.  Screening for certain conditions.  Healthy lifestyle choices, such as eating a healthy diet, getting regular exercise, not using drugs or products that contain nicotine and tobacco, and limiting alcohol use. What can I expect for my preventive care visit? Physical exam Your health care provider will check:  Height and weight. These may be used to calculate body mass index (BMI), which is a measurement that tells if you are at a healthy weight.  Heart rate and blood pressure.  Your skin for abnormal spots. Counseling Your health care provider may ask you questions about:  Alcohol, tobacco, and drug use.  Emotional well-being.  Home and relationship well-being.  Sexual activity.  Eating habits.  Work and work Statistician. What immunizations do I need?  Influenza (flu) vaccine  This is recommended every year. Tetanus, diphtheria, and pertussis (Tdap) vaccine  You may need a Td  booster every 10 years. Varicella (chickenpox) vaccine  You may need this vaccine if you have not already been vaccinated. Zoster (shingles) vaccine  You may need this after age 22. Measles, mumps, and rubella (MMR) vaccine  You may need at least one dose of MMR if you were born in 1957 or later. You may also need a second dose. Pneumococcal conjugate (PCV13) vaccine  You may need this if you have certain conditions and were not previously vaccinated. Pneumococcal polysaccharide (PPSV23) vaccine  You may need one or two doses if you smoke cigarettes or if you have certain conditions. Meningococcal conjugate (MenACWY) vaccine  You may need this if you have certain conditions. Hepatitis A vaccine  You may need this if you have certain conditions or if you travel or work in places where you may be exposed to hepatitis A. Hepatitis B vaccine  You may need this if you have certain conditions or if you travel or work in places where you may be exposed to hepatitis B. Haemophilus influenzae type b (Hib) vaccine  You may need this if you have certain risk factors. Human papillomavirus (HPV) vaccine  If recommended by your health care provider, you may need three doses over 6 months. You may receive vaccines as individual doses or as more than one vaccine together in one shot (combination vaccines). Talk with your health care provider about the risks and benefits of combination vaccines. What tests do I need? Blood tests  Lipid and cholesterol levels. These may be checked every 5 years, or more frequently if you are over 65 years old.  Hepatitis C test.  Hepatitis B test. Screening  Lung cancer screening. You may have this screening every year starting at age 78 if you have a 30-pack-year history of smoking and currently smoke or have quit within the past 15 years.  Prostate cancer screening. Recommendations will  vary depending on your family history and other risks.  Colorectal  cancer screening. All adults should have this screening starting at age 50 and continuing until age 75. Your health care provider may recommend screening at age 64 if you are at increased risk. You will have tests every 1-10 years, depending on your results and the type of screening test.  Diabetes screening. This is done by checking your blood sugar (glucose) after you have not eaten for a while (fasting). You may have this done every 1-3 years.  Sexually transmitted disease (STD) testing. Follow these instructions at home: Eating and drinking  Eat a diet that includes fresh fruits and vegetables, whole grains, lean protein, and low-fat dairy products.  Take vitamin and mineral supplements as recommended by your health care provider.  Do not drink alcohol if your health care provider tells you not to drink.  If you drink alcohol: ? Limit how much you have to 0-2 drinks a day. ? Be aware of how much alcohol is in your drink. In the [place] one drink equals one 12 oz bottle of beer (355 mL), one 5 oz glass of wine (148 mL), or one 1 oz glass of hard liquor (44 mL). Lifestyle  Take daily care of your teeth and gums.  Stay active. Exercise for at least 30 minutes on 5 or more days each week.  Do not use any products that contain nicotine or tobacco, such as cigarettes, e-cigarettes, and chewing tobacco. If you need help quitting, ask your health care provider.  If you are sexually active, practice safe sex. Use a condom or other form of protection to prevent STIs (sexually transmitted infections).  Talk with your health care provider about taking a low-dose aspirin every day starting at age 55. What's next?  Go to your health care provider once a year for a well check visit.  Ask your health care provider how often you should have your eyes and teeth checked.  Stay up to date on all vaccines. This information is not intended to replace advice given to you by your health care provider.  Make sure you discuss any questions you have with your health care provider. Document Revised: 12/12/2017 Document Reviewed: 12/12/2017 Elsevier Patient Education  2020 [place] for [employer] Calories are units of energy. Your body needs a certain amount of calories from food to keep you going throughout the day. When you eat more calories than your body needs, your body stores the extra calories as fat. When you eat fewer calories than your body needs, your body burns fat to get the energy it needs. Calorie counting means keeping track of how many calories you eat and drink each day. Calorie counting can be helpful if you need to lose weight. If you make sure to eat fewer calories than your body needs, you should lose weight. Ask your health care provider what a healthy weight is for you. For calorie counting to work, you will need to eat the right number of calories in a day in order to lose a healthy amount of weight per week. A dietitian can help you determine how many calories you need in a day and will give you suggestions on how to reach your calorie goal.  A healthy amount of weight to lose per week is usually 1-2 lb (0.5-0.9 kg). This usually means that your daily calorie intake should be reduced by 500-750 calories.  Eating 1,200 - 1,500 calories per day  can help most women lose weight.  Eating 1,500 - 1,800 calories per day can help most men lose weight. What is my plan? My goal is to have __________ calories per day. If I have this many calories per day, I should lose around __________ pounds per week. What do I need to know about calorie counting? In order to meet your daily calorie goal, you will need to:  Find out how many calories are in each food you would like to eat. Try to do this before you eat.  Decide how much of the food you plan to eat.  Write down what you ate and how many calories it had. Doing this is called keeping a food log. To  successfully lose weight, it is important to balance calorie counting with a healthy lifestyle that includes regular activity. Aim for 150 minutes of moderate exercise (such as walking) or 75 minutes of vigorous exercise (such as running) each week. Where do I find calorie information?  The number of calories in a food can be found on a Nutrition Facts label. If a food does not have a Nutrition Facts label, try to look up the calories online or ask your dietitian for help. Remember that calories are listed per serving. If you choose to have more than one serving of a food, you will have to multiply the calories per serving by the amount of servings you plan to eat. For example, the label on a package of bread might say that a serving size is 1 slice and that there are 90 calories in a serving. If you eat 1 slice, you will have eaten 90 calories. If you eat 2 slices, you will have eaten 180 calories. How do I keep a food log? Immediately after each meal, record the following information in your food log:  What you ate. Don't forget to include toppings, sauces, and other extras on the food.  How much you ate. This can be measured in cups, ounces, or number of items.  How many calories each food and drink had.  The total number of calories in the meal. Keep your food log near you, such as in a small notebook in your pocket, or use a mobile app or website. Some programs will calculate calories for you and show you how many calories you have left for the day to meet your goal. What are some calorie counting tips?   Use your calories on foods and drinks that will fill you up and not leave you hungry: ? Some examples of foods that fill you up are nuts and nut butters, vegetables, lean proteins, and high-fiber foods like whole grains. High-fiber foods are foods with more than 5 g fiber per serving. ? Drinks such as sodas, specialty coffee drinks, alcohol, and juices have a lot of calories, yet do not  fill you up.  Eat nutritious foods and avoid empty calories. Empty calories are calories you get from foods or beverages that do not have many vitamins or protein, such as candy, sweets, and soda. It is better to have a nutritious high-calorie food (such as an avocado) than a food with few nutrients (such as a bag of chips).  Know how many calories are in the foods you eat most often. This will help you calculate calorie counts faster.  Pay attention to calories in drinks. Low-calorie drinks include water and unsweetened drinks.  Pay attention to nutrition labels for "low fat" or "fat free" foods. These foods sometimes  have the same amount of calories or more calories than the full fat versions. They also often have added sugar, starch, or salt, to make up for flavor that was removed with the fat.  Find a way of tracking calories that works for you. Get creative. Try different apps or programs if writing down calories does not work for you. What are some portion control tips?  Know how many calories are in a serving. This will help you know how many servings of a certain food you can have.  Use a measuring cup to measure serving sizes. You could also try weighing out portions on a kitchen scale. With time, you will be able to estimate serving sizes for some foods.  Take some time to put servings of different foods on your favorite plates, bowls, and cups so you know what a serving looks like.  Try not to eat straight from a bag or box. Doing this can lead to overeating. Put the amount you would like to eat in a cup or on a plate to make sure you are eating the right portion.  Use smaller plates, glasses, and bowls to prevent overeating.  Try not to multitask (for example, watch TV or use your computer) while eating. If it is time to eat, sit down at a table and enjoy your food. This will help you to know when you are full. It will also help you to be aware of what you are eating and how much  you are eating. What are tips for following this plan? Reading food labels  Check the calorie count compared to the serving size. The serving size may be smaller than what you are used to eating.  Check the source of the calories. Make sure the food you are eating is high in vitamins and protein and low in saturated and trans fats. Shopping  Read nutrition labels while you shop. This will help you make healthy decisions before you decide to purchase your food.  Make a grocery list and stick to it. Cooking  Try to cook your favorite foods in a healthier way. For example, try baking instead of frying.  Use low-fat dairy products. Meal planning  Use more fruits and vegetables. Half of your plate should be fruits and vegetables.  Include lean proteins like poultry and fish. How do I count calories when eating out?  Ask for smaller portion sizes.  Consider sharing an entree and sides instead of getting your own entree.  If you get your own entree, eat only half. Ask for a box at the beginning of your meal and put the rest of your entree in it so you are not tempted to eat it.  If calories are listed on the menu, choose the lower calorie options.  Choose dishes that include vegetables, fruits, whole grains, low-fat dairy products, and lean protein.  Choose items that are boiled, broiled, grilled, or steamed. Stay away from items that are buttered, battered, fried, or served with cream sauce. Items labeled "crispy" are usually fried, unless stated otherwise.  Choose water, low-fat milk, unsweetened iced tea, or other drinks without added sugar. If you want an alcoholic beverage, choose a lower calorie option such as a glass of wine or light beer.  Ask for dressings, sauces, and syrups on the side. These are usually high in calories, so you should limit the amount you eat.  If you want a salad, choose a garden salad and ask for grilled meats. Avoid extra  toppings like bacon, cheese,  or fried items. Ask for the dressing on the side, or ask for olive oil and vinegar or lemon to use as dressing.  Estimate how many servings of a food you are given. For example, a serving of cooked rice is  cup or about the size of half a baseball. Knowing serving sizes will help you be aware of how much food you are eating at restaurants. The list below tells you how big or small some common portion sizes are based on everyday objects: ? 1 oz--4 stacked dice. ? 3 oz--1 deck of cards. ? 1 tsp--1 die. ? 1 Tbsp-- a ping-pong ball. ? 2 Tbsp--1 ping-pong ball. ?  cup-- baseball. ? 1 cup--1 baseball. Summary  Calorie counting means keeping track of how many calories you eat and drink each day. If you eat fewer calories than your body needs, you should lose weight.  A healthy amount of weight to lose per week is usually 1-2 lb (0.5-0.9 kg). This usually means reducing your daily calorie intake by 500-750 calories.  The number of calories in a food can be found on a Nutrition Facts label. If a food does not have a Nutrition Facts label, try to look up the calories online or ask your dietitian for help.  Use your calories on foods and drinks that will fill you up, and not on foods and drinks that will leave you hungry.  Use smaller plates, glasses, and bowls to prevent overeating. This information is not intended to replace advice given to you by your health care provider. Make sure you discuss any questions you have with your health care provider. Document Revised: 09/06/2017 Document Reviewed: 11/18/2015 Elsevier Patient Education  [place].

## 2019-07-13 ENCOUNTER — Encounter (INDEPENDENT_AMBULATORY_CARE_PROVIDER_SITE_OTHER): Payer: Self-pay | Admitting: Otolaryngology

## 2019-07-13 ENCOUNTER — Other Ambulatory Visit: Payer: Self-pay

## 2019-07-13 ENCOUNTER — Ambulatory Visit (INDEPENDENT_AMBULATORY_CARE_PROVIDER_SITE_OTHER): Payer: Medicare Other | Admitting: Otolaryngology

## 2019-07-13 VITALS — Temp 98.1°F

## 2019-07-13 DIAGNOSIS — H903 Sensorineural hearing loss, bilateral: Secondary | ICD-10-CM | POA: Diagnosis not present

## 2019-07-13 DIAGNOSIS — H9042 Sensorineural hearing loss, unilateral, left ear, with unrestricted hearing on the contralateral side: Secondary | ICD-10-CM | POA: Diagnosis not present

## 2019-07-13 NOTE — Progress Notes (Signed)
HPI: Marcus K Rapheal Masso. is a 49 y.o. male who presents is referred by Dr. Doreene Burke For evaluation of clogged left ear that he has had for a number of years.  He feels like it needs to open up in order to hear adequately but he is unable to open it.  He apparently has had this for a number of years.  He has never had a previous hearing test.. Patient has tried nasal steroid spray without relief of symptoms.  Past Medical History:  Diagnosis Date  . Heart murmur   . Hypertension    Past Surgical History:  Procedure Laterality Date  . ANKLE SURGERY    . neck fused    . SHOULDER SURGERY    . ULNAR NERVE REPAIR     Social History   Socioeconomic History  . Marital status: Married    Spouse name: Not on file  . Number of children: Not on file  . Years of education: Not on file  . Highest education level: Not on file  Occupational History  . Not on file  Tobacco Use  . Smoking status: Never Smoker  . Smokeless tobacco: Never Used  Substance and Sexual Activity  . Alcohol use: Yes    Comment: social  . Drug use: Never  . Sexual activity: Not on file  Other Topics Concern  . Not on file  Social History Narrative  . Not on file   Social Determinants of Health   Financial Resource Strain:   . Difficulty of Paying Living Expenses:   Food Insecurity:   . Worried About Programme researcher, broadcasting/film/video in the Last Year:   . Barista in the Last Year:   Transportation Needs:   . Freight forwarder (Medical):   Marland Kitchen Lack of Transportation (Non-Medical):   Physical Activity:   . Days of Exercise per Week:   . Minutes of Exercise per Session:   Stress:   . Feeling of Stress :   Social Connections:   . Frequency of Communication with Friends and Family:   . Frequency of Social Gatherings with Friends and Family:   . Attends Religious Services:   . Active Member of Clubs or Organizations:   . Attends Banker Meetings:   Marland Kitchen Marital Status:    Family History  Problem  Relation Age of Onset  . Lung disease Mother   . Cancer Father        esophageal cancer  . Cancer Maternal Grandfather        colon  . Cancer Paternal Grandmother        stomach   No Known Allergies Prior to Admission medications   Medication Sig Start Date End Date Taking? Authorizing Provider  amLODipine (NORVASC) 5 MG tablet Take 1 tablet (5 mg total) by mouth daily. 06/25/19  Yes Mliss Sax, MD  chlorthalidone (HYGROTON) 25 MG tablet Take 1 tablet (25 mg total) by mouth daily. 06/25/19  Yes Mliss Sax, MD  esomeprazole (NEXIUM) 20 MG capsule Take 20 mg by mouth daily at 12 noon.   Yes [provider]  losartan (COZAAR) 100 MG tablet Take 1 tablet (100 mg total) by mouth daily. 06/25/19  Yes Mliss Sax, MD  mometasone (NASONEX) 50 MCG/ACT nasal spray Place 2 sprays into the nose daily. 05/25/19  Yes Mliss Sax, MD  tadalafil (CIALIS) 20 MG tablet Take 0.5-1 tablets (10-20 mg total) by mouth every other day as needed for erectile  dysfunction. 04/20/19  Yes Mliss Sax, MD     Positive ROS: Otherwise negative  All other systems have been reviewed and were otherwise negative with the exception of those mentioned in the HPI and as above.  Physical Exam: Constitutional: Alert, well-appearing, no acute distress. Ears: External ears without lesions or tenderness. Ear canals are clear bilaterally with intact, clear TMs bilaterally with normal mobility on pneumatic otoscopy. Nasal: External nose without lesions.. Clear nasal passages. Oral: Lips and gums without lesions. Tongue and palate mucosa without lesions. Posterior oropharynx clear. Neck: No palpable adenopathy or masses Respiratory: Breathing comfortably  Skin: No facial/neck lesions or rash noted.  Audiogram in the office today demonstrated symmetric normal hearing in both ears up to 2000 frequency but then he has a drop in hearing in the left ear only at 3000 and  4000 frequency down to 40 dB with the right ear hearing approximately 20 and 25 dB.  He had type A tympanograms bilaterally with no middle ear pressure noted.  SRT's were 10 dB bilaterally.  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  Plan: At this point he would possibly benefit from use of a hearing aid in the left ear but there is no medical therapy that will improve his hearing. Did recommend obtaining another hearing test in 1 to 2 years or earlier if he notices any drop in his hearing.   Narda Bonds, MD   CC:

## 2019-07-14 ENCOUNTER — Encounter (INDEPENDENT_AMBULATORY_CARE_PROVIDER_SITE_OTHER): Payer: Self-pay

## 2019-07-27 ENCOUNTER — Ambulatory Visit: Payer: Medicare Other | Admitting: Cardiology

## 2019-07-27 ENCOUNTER — Encounter: Payer: Self-pay | Admitting: Cardiology

## 2019-07-27 ENCOUNTER — Other Ambulatory Visit: Payer: Self-pay

## 2019-07-27 VITALS — BP 146/80 | HR 68 | Ht 73.0 in | Wt 272.0 lb

## 2019-07-27 DIAGNOSIS — E7800 Pure hypercholesterolemia, unspecified: Secondary | ICD-10-CM

## 2019-07-27 DIAGNOSIS — I10 Essential (primary) hypertension: Secondary | ICD-10-CM

## 2019-07-27 DIAGNOSIS — Q231 Congenital insufficiency of aortic valve: Secondary | ICD-10-CM | POA: Diagnosis not present

## 2019-07-27 NOTE — Progress Notes (Signed)
Cardiology Office Note:    Date:  07/27/2019   ID:  Marcus Hilts., DOB 01-Nov-1970, MRN [id number]  PCP:  Mliss Sax, MD  Cardiologist:  Garwin Brothers, MD   Referring MD: Mliss Sax,*    ASSESSMENT:    1. Bicuspid aortic valve   2. Essential hypertension   3. Hypercholesterolemia    PLAN:    In order of problems listed above:  1. Primary prevention stressed with the patient.  Importance of compliance with diet medication stressed and he vocalized understanding.  He was advised to exercise on a regular basis and he promises to do so. 2. Bicuspid aortic valve: He has been evaluated in the past by cardiologist and told to have bicuspid aortic valve.  He is here for follow-up of this.  Echocardiogram will be done to assess murmur heard on auscultation. 3. In view of the history of bicuspid aortic valve we will do a CT chest to assess the aorta in view of aortopathy associated with bicuspid aortic valve 4. Mixed dyslipidemia and overweight: I discussed lipids with him at length I reviewed the numbers and explained recent diet was explained vocalized understanding and promises to do better. 5. Patient will be seen in follow-up appointment in 3 months or earlier if the patient has any concerns    Medication Adjustments/Labs and Tests Ordered: Current medicines are reviewed at length with the patient today.  Concerns regarding medicines are outlined above.  Orders Placed This Encounter  Procedures  . EKG 12-Lead   No orders of the defined types were placed in this encounter.    History of Present Illness:    Marcus K Kel Senn. is a 49 y.o. male who is being seen today for the evaluation of bicuspid aortic valve and essential hypertension at the request of Mliss Sax,*.  Patient is a pleasant 49 year old male.  He has past medical history of essential hypertension, bicuspid aortic valve and dyslipidemia.  He leads a sedentary lifestyle.  No  chest pain orthopnea or PND.  He mentions to me that he rides a bicycle on and off and with this he has no symptoms.  At the time of my evaluation, the patient is alert awake oriented and in no distress.  He has been advised by his cardiologist in the past of having aortic bicuspid valve and he is 58-year-old daughter had congenital aortic stenosis based on his history and she underwent surgery in the past.  Past Medical History:  Diagnosis Date  . Heart murmur   . Hypertension     Past Surgical History:  Procedure Laterality Date  . ANKLE SURGERY Right    reconstructed  . neck fused    . SHOULDER SURGERY     X3  . ULNAR NERVE REPAIR      Current Medications: Current Meds  Medication Sig  . amLODipine (NORVASC) 5 MG tablet Take 1 tablet (5 mg total) by mouth daily.  . chlorthalidone (HYGROTON) 25 MG tablet Take 1 tablet (25 mg total) by mouth daily.  Marland Kitchen esomeprazole (NEXIUM) 20 MG capsule Take 20 mg by mouth daily at 12 noon.  Marland Kitchen losartan (COZAAR) 100 MG tablet Take 1 tablet (100 mg total) by mouth daily.  . mometasone (NASONEX) 50 MCG/ACT nasal spray Place 2 sprays into the nose daily.  . naproxen sodium (ALEVE) 220 MG tablet Take 220 mg by mouth as needed.  . tadalafil (CIALIS) 20 MG tablet Take 0.5-1 tablets (10-20 mg total) by  mouth every other day as needed for erectile dysfunction.     Allergies:   Patient has no known allergies.   Social History   Socioeconomic History  . Marital status: Married    Spouse name: Not on file  . Number of children: Not on file  . Years of education: Not on file  . Highest education level: Not on file  Occupational History  . Not on file  Tobacco Use  . Smoking status: Never Smoker  . Smokeless tobacco: Never Used  Substance and Sexual Activity  . Alcohol use: Yes    Comment: social  . Drug use: Never  . Sexual activity: Not on file  Other Topics Concern  . Not on file  Social History Narrative  . Not on file   Social  Determinants of Health   Financial Resource Strain:   . Difficulty of Paying Living Expenses:   Food Insecurity:   . Worried About Programme researcher, broadcasting/film/video in the Last Year:   . Barista in the Last Year:   Transportation Needs:   . Freight forwarder (Medical):   Marland Kitchen Lack of Transportation (Non-Medical):   Physical Activity:   . Days of Exercise per Week:   . Minutes of Exercise per Session:   Stress:   . Feeling of Stress :   Social Connections:   . Frequency of Communication with Friends and Family:   . Frequency of Social Gatherings with Friends and Family:   . Attends Religious Services:   . Active Member of Clubs or Organizations:   . Attends Banker Meetings:   Marland Kitchen Marital Status:      Family History: The patient's family history includes Cancer in his father, maternal grandfather, and paternal grandmother; Lung disease in his mother.  ROS:   Please see the history of present illness.    All other systems reviewed and are negative.  EKGs/Labs/Other Studies Reviewed:    The following studies were reviewed today: EKG reveals sinus rhythm and nonspecific ST-T changes   Recent Labs: 08/20/2018: TSH 1.32 06/25/2019: ALT 47; BUN 20; Creatinine, Ser 1.16; Hemoglobin 15.1; Platelets 230.0; Potassium 4.2; Sodium 137  Recent Lipid Panel    Component Value Date/Time   CHOL 197 08/20/2018 0916   TRIG 79.0 08/20/2018 0916   HDL 48.60 08/20/2018 0916   CHOLHDL 4 08/20/2018 0916   VLDL 15.8 08/20/2018 0916   LDLCALC 133 (H) 08/20/2018 0916   LDLDIRECT 145.0 06/25/2019 0916    Physical Exam:    VS:  BP (!) 146/80 (BP Location: Left Arm, Patient Position: Sitting, Cuff Size: Large)   Pulse 68   Ht 6\' 1"  (1.854 m)   Wt (!) 272 lb (123.4 kg)   SpO2 98%   BMI 35.89 kg/m     Wt Readings from Last 3 Encounters:  07/27/19 (!) 272 lb (123.4 kg)  06/25/19 270 lb 9.6 oz (122.7 kg)  05/25/19 278 lb 6.4 oz (126.3 kg)     GEN: Patient is in no acute  distress HEENT: Normal NECK: No JVD; No carotid bruits LYMPHATICS: No lymphadenopathy CARDIAC: S1 S2 regular, 2/6 systolic murmur at the apex. RESPIRATORY:  Clear to auscultation without rales, wheezing or rhonchi  ABDOMEN: Soft, non-tender, non-distended MUSCULOSKELETAL:  No edema; No deformity  SKIN: Warm and dry NEUROLOGIC:  Alert and oriented x 3 PSYCHIATRIC:  Normal affect    Signed, 05/27/19, MD  07/27/2019 10:07 AM    [facility]

## 2019-07-27 NOTE — Patient Instructions (Addendum)
Medication Instructions:  Your physician recommends that you continue on your current medications as directed. Please refer to the Current Medication list given to you today.  *If you need a refill on your cardiac medications before your next appointment, please call your pharmacy*   Lab Work: None ordered  If you have labs (blood work) drawn today and your tests are completely normal, you will receive your results only by: Marland Kitchen MyChart Message (if you have MyChart) OR . A paper copy in the mail If you have any lab test that is abnormal or we need to change your treatment, we will call you to review the results.   Testing/Procedures: Your physician has requested that you have an echocardiogram. Echocardiography is a painless test that uses sound waves to create images of your heart. It provides your doctor with information about the size and shape of your heart and how well your heart's chambers and valves are working. This procedure takes approximately one hour. There are no restrictions for this procedure.  Non-Cardiac CT scanning, (CAT scanning), is a noninvasive, special x-ray that produces cross-sectional images of the body using x-rays and a computer. CT scans help physicians diagnose and treat medical conditions. For some CT exams, a contrast material is used to enhance visibility in the area of the body being studied. CT scans provide greater clarity and reveal more details than regular x-ray exams.      Follow-Up: At [facility], you and your health needs are our priority.  As part of our continuing mission to provide you with exceptional heart care, we have created designated Provider Care Teams.  These Care Teams include your primary Cardiologist (physician) and Advanced Practice Providers (APPs -  Physician Assistants and Nurse Practitioners) who all work together to provide you with the care you need, when you need it.  We recommend signing up for the patient portal called  "MyChart".  Sign up information is provided on this After Visit Summary.  MyChart is used to connect with patients for Virtual Visits (Telemedicine).  Patients are able to view lab/test results, encounter notes, upcoming appointments, etc.  Non-urgent messages can be sent to your provider as well.   To learn more about what you can do with MyChart, go to [email or web address].    Your next appointment:   3 month(s)  The format for your next appointment:   In Person  Provider:   Belva Crome, MD   Other Instructions  Echocardiogram An echocardiogram is a procedure that uses painless sound waves (ultrasound) to produce an image of the heart. Images from an echocardiogram can provide important information about:  Signs of coronary artery disease (CAD).  Aneurysm detection. An aneurysm is a weak or damaged part of an artery wall that bulges out from the normal force of blood pumping through the body.  Heart size and shape. Changes in the size or shape of the heart can be associated with certain conditions, including heart failure, aneurysm, and CAD.  Heart muscle function.  Heart valve function.  Signs of a past heart attack.  Fluid buildup around the heart.  Thickening of the heart muscle.  A tumor or infectious growth around the heart valves. Tell a health care provider about:  Any allergies you have.  All medicines you are taking, including vitamins, herbs, eye drops, creams, and over-the-counter medicines.  Any blood disorders you have.  Any surgeries you have had.  Any medical conditions you have.  Whether you are pregnant or may be  pregnant. What are the risks? Generally, this is a safe procedure. However, problems may occur, including:  Allergic reaction to dye (contrast) that may be used during the procedure. What happens before the procedure? No specific preparation is needed. You may eat and drink normally. What happens during the procedure?   An IV  tube may be inserted into one of your veins.  You may receive contrast through this tube. A contrast is an injection that improves the quality of the pictures from your heart.  A gel will be applied to your chest.  A wand-like tool (transducer) will be moved over your chest. The gel will help to transmit the sound waves from the transducer.  The sound waves will harmlessly bounce off of your heart to allow the heart images to be captured in real-time motion. The images will be recorded on a computer. The procedure may vary among health care providers and hospitals. What happens after the procedure?  You may return to your normal, everyday life, including diet, activities, and medicines, unless your health care provider tells you not to do that. Summary  An echocardiogram is a procedure that uses painless sound waves (ultrasound) to produce an image of the heart.  Images from an echocardiogram can provide important information about the size and shape of your heart, heart muscle function, heart valve function, and fluid buildup around your heart.  You do not need to do anything to prepare before this procedure. You may eat and drink normally.  After the echocardiogram is completed, you may return to your normal, everyday life, unless your health care provider tells you not to do that. This information is not intended to replace advice given to you by your health care provider. Make sure you discuss any questions you have with your health care provider. Document Revised: 04/10/2018 Document Reviewed: 01/21/2016 Elsevier Patient Education  2020 [employer] Inc.   CT Scan  A CT scan (computed tomography scan) is an imaging scan. It uses X-rays and a computer to make detailed pictures of different areas inside the body. A CT scan can give more information than a regular X-ray exam. A CT scan provides data about internal organs, soft tissue structures, blood vessels, and bones. In this  procedure, the pictures will be taken in a large machine that has an opening (CT scanner). Tell a health care provider about:  Any allergies you have.  All medicines you are taking, including vitamins, herbs, eye drops, creams, and over-the-counter medicines.  Any blood disorders you have.  Any surgeries you have had.  Any medical conditions you have.  Whether you are pregnant or may be pregnant. What are the risks? Generally, this is a safe procedure. However, problems may occur, including:  An allergic reaction to dyes.  Development of cancer from excessive exposure to radiation from multiple CT scans. This is rare. What happens before the procedure? Staying hydrated Follow instructions from your health care provider about hydration, which may include:  Up to 2 hours before the procedure - you may continue to drink clear liquids, such as water, clear fruit juice, black coffee, and plain tea. Eating and drinking restrictions Follow instructions from your health care provider about eating and drinking, which may include:  24 hours before the procedure - stop drinking caffeinated beverages, such as energy drinks, tea, soda, coffee, and hot chocolate.  8 hours before the procedure - stop eating heavy meals or foods such as meat, fried foods, or fatty foods.  6 hours  before the procedure - stop eating light meals or foods, such as toast or cereal.  6 hours before the procedure - stop drinking milk or drinks that contain milk.  2 hours before the procedure - stop drinking clear liquids. General instructions  Remove any jewelry.  Ask your health care provider about changing or stopping your regular medicines. This is especially important if you are taking diabetes medicines or blood thinners. What happens during the procedure?  You will lie on a table with your arms above your head.  An IV tube may be inserted into one of your veins.  The contrast dye may be injected into  the IV tube. You may feel warm or have a metallic taste in your mouth.  The table you will be lying on will move into the CT scanner.  You will be able to see, hear, and talk to the person running the machine while you are in it. Follow that person's instructions.  The CT scanner will move around you to take pictures. Do not move while it is scanning. Staying still helps the scanner to get a good image.  When the best possible pictures have been taken, the machine will be turned off. The table will be moved out of the machine.  The IV tube will be removed. The procedure may vary among health care providers and hospitals. What happens after the procedure?  It is up to you to get the results of your procedure. Ask your health care provider, or the department that is doing the procedure, when your results will be ready. Summary  A CT scan is an imaging scan.  A CT scan uses X-rays and a computer to make detailed pictures of different areas of your body.  Follow instructions from your health care provider about eating and drinking before the procedure.  You will be able to see, hear, and talk to the person running the machine while you are in it. Follow that person's instructions. This information is not intended to replace advice given to you by your health care provider. Make sure you discuss any questions you have with your health care provider. Document Revised: 05/05/2018 Document Reviewed: 01/21/2016 Elsevier Patient Education  2020 [employer].

## 2019-08-05 ENCOUNTER — Other Ambulatory Visit: Payer: Self-pay

## 2019-08-05 ENCOUNTER — Ambulatory Visit (HOSPITAL_COMMUNITY)
Admission: RE | Admit: 2019-08-05 | Discharge: 2019-08-05 | Disposition: A | Discharge status: Home / Self Care | Payer: Medicare Other | Source: Ambulatory Visit | Attending: Cardiology | Admitting: Cardiology

## 2019-08-05 DIAGNOSIS — Q231 Congenital insufficiency of aortic valve: Secondary | ICD-10-CM

## 2019-08-05 NOTE — Progress Notes (Signed)
  Echocardiogram 2D Echocardiogram has been performed.  Marcus Diaz G Bora Bost 08/05/2019, 10:11 AM

## 2019-08-06 LAB — ECHOCARDIOGRAM COMPLETE
Area-P 1/2: 3.03 cm2
S' Lateral: 3.3 cm

## 2019-08-10 ENCOUNTER — Other Ambulatory Visit: Payer: Self-pay

## 2019-08-11 ENCOUNTER — Encounter: Payer: Self-pay | Admitting: Family Medicine

## 2019-08-11 ENCOUNTER — Ambulatory Visit: Payer: Medicare Other | Admitting: Family Medicine

## 2019-08-11 VITALS — BP 120/76 | HR 66 | Temp 98.2°F | Ht 73.0 in | Wt 266.0 lb

## 2019-08-11 DIAGNOSIS — R7989 Other specified abnormal findings of blood chemistry: Secondary | ICD-10-CM | POA: Diagnosis not present

## 2019-08-11 DIAGNOSIS — R7309 Other abnormal glucose: Secondary | ICD-10-CM

## 2019-08-11 DIAGNOSIS — E876 Hypokalemia: Secondary | ICD-10-CM

## 2019-08-11 DIAGNOSIS — I10 Essential (primary) hypertension: Secondary | ICD-10-CM | POA: Diagnosis not present

## 2019-08-11 HISTORY — DX: Other specified abnormal findings of blood chemistry: R79.89

## 2019-08-11 HISTORY — DX: Other abnormal glucose: R73.09

## 2019-08-11 LAB — BASIC METABOLIC PANEL
BUN: 20 mg/dL (ref 6–23)
CO2: 31 mEq/L (ref 19–32)
Calcium: 10 mg/dL (ref 8.4–10.5)
Chloride: 98 mEq/L (ref 96–112)
Creatinine, Ser: 1.17 mg/dL (ref 0.40–1.50)
GFR: 65.72 mL/min (ref >60.00)
Glucose, Bld: 100 mg/dL — ABNORMAL HIGH (ref 70–99)
Potassium: 3.3 mEq/L — ABNORMAL LOW (ref 3.5–5.1)
Sodium: 138 mEq/L (ref 135–145)

## 2019-08-11 LAB — URINALYSIS, ROUTINE W REFLEX MICROSCOPIC
Bilirubin Urine: NEGATIVE
Hgb urine dipstick: NEGATIVE
Ketones, ur: NEGATIVE
Leukocytes,Ua: NEGATIVE
Nitrite: NEGATIVE
RBC / HPF: NONE SEEN (ref 0–?)
Specific Gravity, Urine: 1.015 (ref 1.000–1.030)
Total Protein, Urine: NEGATIVE
Urine Glucose: NEGATIVE
Urobilinogen, UA: 0.2 (ref 0.0–1.0)
WBC, UA: NONE SEEN (ref 0–?)
pH: 8 (ref 5.0–8.0)

## 2019-08-11 LAB — CBC
HCT: 44.3 % (ref 39.0–52.0)
Hemoglobin: 15 g/dL (ref 13.0–17.0)
MCHC: 33.8 g/dL (ref 30.0–36.0)
MCV: 88.3 fl (ref 78.0–100.0)
Platelets: 220 10*3/uL (ref 150.0–400.0)
RBC: 5.02 Mil/uL (ref 4.22–5.81)
RDW: 13.5 % (ref 11.5–15.5)
WBC: 6 10*3/uL (ref 4.0–10.5)

## 2019-08-11 LAB — GAMMA GT: GGT: 28 U/L (ref 7–51)

## 2019-08-11 LAB — HEPATIC FUNCTION PANEL
ALT: 25 U/L (ref 0–53)
AST: 32 U/L (ref 0–37)
Albumin: 4.7 g/dL (ref 3.5–5.2)
Alkaline Phosphatase: 52 U/L (ref 39–117)
Bilirubin, Direct: 0.1 mg/dL (ref 0.0–0.3)
Total Bilirubin: 0.7 mg/dL (ref 0.2–1.2)
Total Protein: 7.8 g/dL (ref 6.0–8.3)

## 2019-08-11 LAB — HEMOGLOBIN A1C: Hgb A1c MFr Bld: 5.7 % (ref 4.6–6.5)

## 2019-08-11 NOTE — Progress Notes (Addendum)
Established Patient Office Visit  Subjective:  Patient ID: Marcus Diaz., male    DOB: [birth year]  Age: 49 y.o. MRN: [id number]  CC:  Chief Complaint  Patient presents with  . Follow-up    6 week follow up on BP and labs, no concerns.     HPI Marcus Diaz [employer]. presents for follow-up of hypertension, elevated liver enzyme and glucose and morbid obesity.  Echocardiogram confirmed a tricuspid aortic valve.  And was normal.  Patient has been losing weight intentionally.  Blood pressures been well controlled on try therapy.  Drinks alcohol on rare occasion.  Says that he had a few beers for his birthday this past weekend.  Other than that he rarely drinks alcohol.  Past Medical History:  Diagnosis Date  . Heart murmur   . Hypertension     Past Surgical History:  Procedure Laterality Date  . ANKLE SURGERY Right    reconstructed  . neck fused    . SHOULDER SURGERY     X3  . ULNAR NERVE REPAIR      Family History  Problem Relation Age of Onset  . Lung disease Mother   . Cancer Father        esophageal cancer  . Cancer Maternal Grandfather        colon  . Cancer Paternal Grandmother        stomach    Social History   Socioeconomic History  . Marital status: Married    Spouse name: Not on file  . Number of children: Not on file  . Years of education: Not on file  . Highest education level: Not on file  Occupational History  . Not on file  Tobacco Use  . Smoking status: Never Smoker  . Smokeless tobacco: Never Used  Substance and Sexual Activity  . Alcohol use: Yes    Comment: rarely  . Drug use: Never  . Sexual activity: Not on file  Other Topics Concern  . Not on file  Social History Narrative  . Not on file   Social Determinants of Health   Financial Resource Strain:   . Difficulty of Paying Living Expenses:   Food Insecurity:   . Worried About Programme researcher, broadcasting/film/video in the Last Year:   . Barista in the Last Year:   Transportation Needs:     . Freight forwarder (Medical):   Marland Kitchen Lack of Transportation (Non-Medical):   Physical Activity:   . Days of Exercise per Week:   . Minutes of Exercise per Session:   Stress:   . Feeling of Stress :   Social Connections:   . Frequency of Communication with Friends and Family:   . Frequency of Social Gatherings with Friends and Family:   . Attends Religious Services:   . Active Member of Clubs or Organizations:   . Attends Banker Meetings:   Marland Kitchen Marital Status:   Intimate Partner Violence:   . Fear of Current or Ex-Partner:   . Emotionally Abused:   Marland Kitchen Physically Abused:   . Sexually Abused:     Outpatient Medications Prior to Visit  Medication Sig Dispense Refill  . amLODipine (NORVASC) 5 MG tablet Take 1 tablet (5 mg total) by mouth daily. 90 tablet 3  . chlorthalidone (HYGROTON) 25 MG tablet Take 1 tablet (25 mg total) by mouth daily. 90 tablet 0  . esomeprazole (NEXIUM) 20 MG capsule Take 20 mg by mouth daily at 12  noon.    . losartan (COZAAR) 100 MG tablet Take 1 tablet (100 mg total) by mouth daily. 90 tablet 0  . naproxen sodium (ALEVE) 220 MG tablet Take 220 mg by mouth as needed.    . tadalafil (CIALIS) 20 MG tablet Take 0.5-1 tablets (10-20 mg total) by mouth every other day as needed for erectile dysfunction. 5 tablet 11  . mometasone (NASONEX) 50 MCG/ACT nasal spray Place 2 sprays into the nose daily. (Patient not taking: Reported on 08/11/2019) 17 g 12   No facility-administered medications prior to visit.    No Known Allergies  ROS Review of Systems  Constitutional: Negative.   HENT: Negative.   Eyes: Negative for photophobia and visual disturbance.  Respiratory: Negative.   Cardiovascular: Negative.   Gastrointestinal: Negative.   Endocrine: Negative for polyphagia and polyuria.  Genitourinary: Negative.   Musculoskeletal: Negative for gait problem and joint swelling.  Allergic/Immunologic: Negative for immunocompromised state.   Neurological: Negative for light-headedness and headaches.  Hematological: Does not bruise/bleed easily.  Psychiatric/Behavioral: Negative.       Objective:    Physical Exam Vitals and nursing note reviewed.  Constitutional:      General: He is not in acute distress.    Appearance: Normal appearance. He is obese. He is not ill-appearing, toxic-appearing or diaphoretic.  HENT:     Head: Normocephalic and atraumatic.     Right Ear: External ear normal.     Left Ear: External ear normal.  Eyes:     General: No scleral icterus.       Right eye: No discharge.        Left eye: No discharge.     Conjunctiva/sclera: Conjunctivae normal.  Cardiovascular:     Rate and Rhythm: Normal rate and regular rhythm.  Pulmonary:     Effort: Pulmonary effort is normal.     Breath sounds: Normal breath sounds.  Musculoskeletal:     Right lower leg: No edema.     Left lower leg: No edema.  Skin:    General: Skin is warm and dry.  Neurological:     Mental Status: He is alert and oriented to person, place, and time.  Psychiatric:        Mood and Affect: Mood normal.        Behavior: Behavior normal.     BP 120/76   Pulse 66   Temp 98.2 F (36.8 C) (Tympanic)   Ht 6\' 1"  (1.854 m)   Wt 266 lb (120.7 kg)   SpO2 96%   BMI 35.09 kg/m  Wt Readings from Last 3 Encounters:  08/11/19 266 lb (120.7 kg)  07/27/19 (!) 272 lb (123.4 kg)  06/25/19 270 lb 9.6 oz (122.7 kg)     Health Maintenance Due  Topic Date Due  . COLONOSCOPY  Never done  . INFLUENZA VACCINE  08/02/2019    There are no preventive care reminders to display for this patient.  Lab Results  Component Value Date   TSH 1.32 08/20/2018   Lab Results  Component Value Date   WBC 6.0 08/11/2019   HGB 15.0 08/11/2019   HCT 44.3 08/11/2019   MCV 88.3 08/11/2019   PLT 220.0 08/11/2019   Lab Results  Component Value Date   NA 138 08/11/2019   Diaz 3.3 (L) 08/11/2019   CO2 31 08/11/2019   GLUCOSE 100 (H) 08/11/2019    BUN 20 08/11/2019   CREATININE 1.17 08/11/2019   BILITOT 0.7 08/11/2019   ALKPHOS 52  08/11/2019   AST 32 08/11/2019   ALT 25 08/11/2019   PROT 7.8 08/11/2019   ALBUMIN 4.7 08/11/2019   CALCIUM 10.0 08/11/2019   GFR 65.72 08/11/2019   Lab Results  Component Value Date   CHOL 197 08/20/2018   Lab Results  Component Value Date   HDL 48.60 08/20/2018   Lab Results  Component Value Date   LDLCALC 133 (H) 08/20/2018   Lab Results  Component Value Date   TRIG 79.0 08/20/2018   Lab Results  Component Value Date   CHOLHDL 4 08/20/2018   Lab Results  Component Value Date   HGBA1C 5.7 08/11/2019      Assessment & Plan:   Problem List Items Addressed This Visit      Cardiovascular and Mediastinum   Essential hypertension - Primary   Relevant Orders   CBC (Completed)   Basic metabolic panel (Completed)   Urinalysis, Routine w reflex microscopic (Completed)     Other   Morbid obesity (HCC)   Elevated LFTs   Relevant Orders   Gamma GT (Completed)   Hepatic function panel (Completed)   Hepatitis C antibody (Completed)   HIV Antibody (routine testing w rflx) (Completed)   Elevated glucose   Relevant Orders   Hemoglobin A1c (Completed)   Hypokalemia   Relevant Medications   potassium chloride SA (KLOR-CON) 20 MEQ tablet   Other Relevant Orders   Potassium      Meds ordered this encounter  Medications  . potassium chloride SA (KLOR-CON) 20 MEQ tablet    Sig: Take 1 tablet (20 mEq total) by mouth daily.    Dispense:  90 tablet    Refill:  1    Follow-up: Return in about 6 months (around 02/11/2020).   Congratulated and encouraged patient to continue his weight loss efforts.  I think much of the above is related to his current weight.  Continue current blood pressure medications. Mliss Sax, MD

## 2019-08-13 DIAGNOSIS — E876 Hypokalemia: Secondary | ICD-10-CM | POA: Insufficient documentation

## 2019-08-13 HISTORY — DX: Hypokalemia: E87.6

## 2019-08-13 MED ORDER — POTASSIUM CHLORIDE CRYS ER 20 MEQ PO TBCR: 20.0000 meq | EXTENDED_RELEASE_TABLET | Freq: Every day | ORAL | 1 refills | Status: DC

## 2019-08-13 NOTE — Addendum Note (Signed)
Addended by: Andrez Grime on: 08/13/2019 07:44 AM   Modules accepted: Orders

## 2019-08-19 ENCOUNTER — Encounter: Payer: Medicare Other | Admitting: Gastroenterology

## 2019-08-21 ENCOUNTER — Encounter: Payer: Self-pay | Admitting: Gastroenterology

## 2019-08-24 LAB — HEPATITIS C ANTIBODY
Hepatitis C Ab: NONREACTIVE
SIGNAL TO CUT-OFF: 0.03 (ref <1.00)

## 2019-08-24 LAB — HIV ANTIBODY (ROUTINE TESTING W REFLEX): HIV 1&2 Ab, 4th Generation: NONREACTIVE

## 2019-08-26 ENCOUNTER — Other Ambulatory Visit: Payer: Self-pay

## 2019-08-26 ENCOUNTER — Ambulatory Visit (INDEPENDENT_AMBULATORY_CARE_PROVIDER_SITE_OTHER)
Admission: RE | Admit: 2019-08-26 | Discharge: 2019-08-26 | Disposition: A | Discharge status: Home / Self Care | Payer: Medicare Other | Source: Ambulatory Visit | Attending: Cardiology | Admitting: Cardiology

## 2019-08-26 DIAGNOSIS — I2510 Atherosclerotic heart disease of native coronary artery without angina pectoris: Secondary | ICD-10-CM | POA: Diagnosis not present

## 2019-08-26 DIAGNOSIS — R590 Localized enlarged lymph nodes: Secondary | ICD-10-CM | POA: Diagnosis not present

## 2019-08-26 DIAGNOSIS — Q231 Congenital insufficiency of aortic valve: Secondary | ICD-10-CM

## 2019-08-26 DIAGNOSIS — I700 Atherosclerosis of aorta: Secondary | ICD-10-CM | POA: Diagnosis not present

## 2019-08-31 ENCOUNTER — Other Ambulatory Visit: Payer: Self-pay

## 2019-08-31 DIAGNOSIS — E7800 Pure hypercholesterolemia, unspecified: Secondary | ICD-10-CM

## 2019-08-31 DIAGNOSIS — R7989 Other specified abnormal findings of blood chemistry: Secondary | ICD-10-CM

## 2019-09-09 DIAGNOSIS — R7989 Other specified abnormal findings of blood chemistry: Secondary | ICD-10-CM | POA: Diagnosis not present

## 2019-09-09 DIAGNOSIS — E7800 Pure hypercholesterolemia, unspecified: Secondary | ICD-10-CM | POA: Diagnosis not present

## 2019-09-10 ENCOUNTER — Telehealth: Payer: Self-pay

## 2019-09-10 LAB — BASIC METABOLIC PANEL
BUN/Creatinine Ratio: 14 (ref 9–20)
BUN: 19 mg/dL (ref 6–24)
CO2: 26 mmol/L (ref 20–29)
Calcium: 10 mg/dL (ref 8.7–10.2)
Chloride: 99 mmol/L (ref 96–106)
Creatinine, Ser: 1.32 mg/dL — ABNORMAL HIGH (ref 0.76–1.27)
GFR calc Af Amer: 72 mL/min/{1.73_m2} (ref >59)
GFR calc non Af Amer: 62 mL/min/{1.73_m2} (ref >59)
Glucose: 97 mg/dL (ref 65–99)
Potassium: 3.8 mmol/L (ref 3.5–5.2)
Sodium: 139 mmol/L (ref 134–144)

## 2019-09-10 LAB — LIPID PANEL
Chol/HDL Ratio: 3.6 ratio (ref 0.0–5.0)
Cholesterol, Total: 187 mg/dL (ref 100–199)
HDL: 52 mg/dL (ref >39)
LDL Chol Calc (NIH): 115 mg/dL — ABNORMAL HIGH (ref 0–99)
Triglycerides: 111 mg/dL (ref 0–149)
VLDL Cholesterol Cal: 20 mg/dL (ref 5–40)

## 2019-09-10 LAB — HEPATIC FUNCTION PANEL
ALT: 26 IU/L (ref 0–44)
AST: 19 IU/L (ref 0–40)
Albumin: 4.8 g/dL (ref 3.8–4.9)
Alkaline Phosphatase: 58 IU/L (ref 48–121)
Bilirubin Total: 0.5 mg/dL (ref 0.0–1.2)
Bilirubin, Direct: 0.17 mg/dL (ref 0.00–0.40)
Total Protein: 7.3 g/dL (ref 6.0–8.5)

## 2019-09-10 NOTE — Telephone Encounter (Signed)
Spoke with patient regarding results and recommendation.  Patient verbalizes understanding and is agreeable to plan of care. Advised patient to call back with any issues or concerns.  

## 2019-09-10 NOTE — Telephone Encounter (Signed)
-----   Message from Garwin Brothers, MD sent at 09/10/2019  8:09 AM EDT ----- The results of the study is unremarkable. Please inform patient. I will discuss in detail at next appointment. Cc  primary care/referring physician Garwin Brothers, MD 09/10/2019 8:09 AM

## 2019-09-15 DIAGNOSIS — R011 Cardiac murmur, unspecified: Secondary | ICD-10-CM | POA: Insufficient documentation

## 2019-09-16 ENCOUNTER — Ambulatory Visit: Payer: Medicare Other | Admitting: Cardiology

## 2019-09-16 ENCOUNTER — Other Ambulatory Visit: Payer: Self-pay

## 2019-09-16 ENCOUNTER — Encounter: Payer: Self-pay | Admitting: Cardiology

## 2019-09-16 VITALS — BP 143/80 | HR 62 | Ht 73.0 in | Wt 260.1 lb

## 2019-09-16 DIAGNOSIS — E7800 Pure hypercholesterolemia, unspecified: Secondary | ICD-10-CM

## 2019-09-16 DIAGNOSIS — I2510 Atherosclerotic heart disease of native coronary artery without angina pectoris: Secondary | ICD-10-CM

## 2019-09-16 DIAGNOSIS — I10 Essential (primary) hypertension: Secondary | ICD-10-CM

## 2019-09-16 DIAGNOSIS — Z79899 Other long term (current) drug therapy: Secondary | ICD-10-CM

## 2019-09-16 HISTORY — DX: Atherosclerotic heart disease of native coronary artery without angina pectoris: I25.10

## 2019-09-16 MED ORDER — ASPIRIN EC 81 MG PO TBEC: 81.0000 mg | DELAYED_RELEASE_TABLET | Freq: Every day | ORAL | 3 refills | Status: AC

## 2019-09-16 MED ORDER — ATORVASTATIN CALCIUM 10 MG PO TABS: 10.0000 mg | ORAL_TABLET | Freq: Every day | ORAL | 3 refills | Status: DC

## 2019-09-16 NOTE — Progress Notes (Signed)
Cardiology Office Note:    Date:  09/16/2019   ID:  Marcus Hilts., DOB Dec 11, 1970, MRN [id number]  PCP:  Mliss Sax, MD  Cardiologist:  Garwin Brothers, MD   Referring MD: Mliss Sax,*    ASSESSMENT:    1. Essential hypertension   2. Hypercholesterolemia   3. Coronary artery calcification seen on CT scan    PLAN:    In order of problems listed above:  1. Coronary artery disease manifested by coronary artery calcification which is significant in this gentleman.  I discussed findings with him at extensive length and he vocalized understanding.  His effort tolerance and exercise is excellent.  He will continue doing this.  He has lost 20 pounds since I saw him last and I congratulated him. 2. Bicuspid valve in the aortic position: We reviewed this.  The echocardiogram revealed tricuspid valve in the aortic position I discussed this with him he is happy about it. 3. Mixed dyslipidemia: In view of secondary prevention I will initiate him on statin 10 mg of atorvastatin daily he will be back in 6 weeks for liver lipid check diet and exercise stressed. 4. Obesity: He is doing very well with diet and exercise and promises to do better. 5. Patient will be seen in follow-up appointment in 6 months or earlier if the patient has any concerns    Medication Adjustments/Labs and Tests Ordered: Current medicines are reviewed at length with the patient today.  Concerns regarding medicines are outlined above.  No orders of the defined types were placed in this encounter.  No orders of the defined types were placed in this encounter.    No chief complaint on file.    History of Present Illness:    Marcus Diaz. is a 49 y.o. male.  Patient was evaluated for bicuspid aortic valve.  His history of essential hypertension and dyslipidemia.  He is overweight and obese.  He mentions to me that he exercises on a regular basis.  No chest pain orthopnea or PND.  At the  time of my evaluation, the patient is alert awake oriented and in no distress.  Past Medical History:  Diagnosis Date  . Backache 12/15/2009  . Bicuspid aortic valve 12/15/2009   Has seen dr. Rennis Harding, due for echocardiogram  Formatting of this note might be different from the original. Has seen dr. Rennis Harding, due for echocardiogram  . Cervical radiculopathy 12/12/2010   Formatting of this note might be different from the original. Surgery C4/5/6 dr. Angelene Giovanni Oct 2012  . Dysfunction of left eustachian tube 05/25/2019  . Dysphagia 02/12/2013  . Elevated glucose 08/11/2019  . Elevated LFTs 08/11/2019  . Erectile dysfunction due to arterial insufficiency 04/20/2019  . Essential hypertension 12/15/2009   Cont with atenolol 50 mg daily bid  Lisinopril 20 mg daily     Last Assessment & Plan:  Bicuspid aortic valve will refer to cardiology/  Will check 24 hour holter as he has had some palpitations.  . Family history of diabetes mellitus 08/21/2010  . Foot pain, right 12/02/2015  . Gastritis 03/20/2013  . Gastroesophageal reflux disease without esophagitis 02/12/2013  . Heart murmur   . History of cardiovascular disorder 08/21/2010  . HTN (hypertension) 12/15/2009   Formatting of this note might be different from the original.   Cont with atenolol 50 mg daily bid  Lisinopril 20 mg daily     Last Assessment & Plan:  Formatting of this note might be  different from the original. Bicuspid aortic valve will refer to cardiology/  Will check 24 hour holter as he has had some palpitations.  . Hypercholesterolemia 12/15/2009   Cont to monitor with diet.  Formatting of this note might be different from the original. Cont to monitor with diet.  . Hyperglycemia 12/15/2009  . Hypertension   . Hypokalemia 08/13/2019  . Irregular Z line of esophagus 03/20/2013  . Joint pain of ankle and foot 12/16/2009  . Lumbar disc disease 02/13/2012  . MDD (major depressive disorder), recurrent, in full remission (HCC) 09/11/2012  . Medial  epicondylitis, left 12/16/2009  . Morbid obesity (HCC) 06/25/2019  . Onychomycosis 10/18/2015  . 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 Preventative health care 12/16/2009  . Shoulder pain, right 12/15/2009  . Sleep apnea, obstructive 12/15/2009  . SOB (shortness of breath) 12/15/2009  . Testosterone deficiency 05/04/2010  . 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 ANKLE SURGERY Right    reconstructed  . neck fused    . SHOULDER SURGERY     X3  . ULNAR NERVE REPAIR      Current Medications: Current Meds  Medication Sig  . amLODipine (NORVASC) 5 MG tablet Take 1 tablet (5 mg total) by mouth daily.  . chlorthalidone (HYGROTON) 25 MG tablet Take 1 tablet (25 mg total) by mouth daily.  Marland Kitchen esomeprazole (NEXIUM) 20 MG capsule Take 20 mg by mouth daily at 12 noon.  Marland Kitchen losartan (COZAAR) 100 MG tablet Take 1 tablet (100 mg total) by mouth daily.  . naproxen sodium (ALEVE) 220 MG tablet Take 220 mg by mouth as needed.  . potassium chloride SA (KLOR-CON) 20 MEQ tablet Take 1 tablet (20 mEq total) by mouth daily.  . tadalafil (CIALIS) 20 MG tablet Take 0.5-1 tablets (10-20 mg total) by mouth every other day as needed for erectile dysfunction.     Allergies:   Patient has no known allergies.   Social History   Socioeconomic History  . Marital status: Married    Spouse name: Not on file  . Number of children: Not on file  . Years of education: Not on file  . Highest education level: Not on file  Occupational History  . Not on file  Tobacco Use  . Smoking status: Never Smoker  . Smokeless tobacco: Never Used  Substance and Sexual Activity  . Alcohol use: Yes    Comment: rarely  . Drug use: Never  . Sexual activity: Not on file  Other Topics Concern  . Not on file  Social History Narrative  . Not on file   Social Determinants of Health   Financial Resource Strain:   . Difficulty of Paying Living Expenses: Not  on file  Food Insecurity:   . Worried About Programme researcher, broadcasting/film/video in the Last Year: Not on file  . Ran Out of Food in the Last Year: Not on file  Transportation Needs:   . Lack of Transportation (Medical): Not on file  . Lack of Transportation (Non-Medical): Not on file  Physical Activity:   . Days of Exercise per Week: Not on file  . Minutes of Exercise per Session: Not on file  Stress:   . Feeling of Stress : Not on file  Social Connections:   . Frequency of Communication with Friends and Family: Not on file  . Frequency of Social Gatherings with Friends and Family: Not on file  . Attends  Religious Services: Not on file  . Active Member of Clubs or Organizations: Not on file  . Attends Banker Meetings: Not on file  . Marital Status: Not on file     Family History: The patient's family history includes Cancer in his father, maternal grandfather, and paternal grandmother; Lung disease in his mother.  ROS:   Please see the history of present illness.    All other systems reviewed and are negative.  EKGs/Labs/Other Studies Reviewed:    The following studies were reviewed today: IMPRESSION: 1. No aneurysm of the thoracic aorta. 2. Aortic atherosclerosis, in addition to 2 vessel coronary artery disease. Please note that although the presence of coronary artery calcium documents the presence of coronary artery disease, the severity of this disease and any potential stenosis cannot be assessed on this non-gated CT examination. Assessment for potential risk factor modification, dietary therapy or pharmacologic therapy may be warranted, if clinically indicated.  Aortic Atherosclerosis (ICD10-I70.0).   Electronically Signed   By: Trudie Reed M.D.   On: 08/26/2019 13:14   IMPRESSIONS    1. Tricuspid aortic valve, morphology best seen in clip 36. The aortic  valve is tricuspid. Aortic valve regurgitation is not visualized. No  aortic stenosis is  present.  2. Left ventricular ejection fraction, by estimation, is 60 to 65%. The  left ventricle has normal function. The left ventricle has no regional  wall motion abnormalities. Left ventricular diastolic parameters were  normal.  3. Right ventricular systolic function is normal. The right ventricular  size is normal. There is normal pulmonary artery systolic pressure. The  estimated right ventricular systolic pressure is 24.5 mmHg.  4. The mitral valve is normal in structure. No evidence of mitral valve  regurgitation. No evidence of mitral stenosis.  5. The inferior vena cava is normal in size with greater than 50%  respiratory variability, suggesting right atrial pressure of 3 mmHg.  6. No Doppler evidence of coarctation of the aorta. Normal caliber  ascending aorta where visualized.  7. Increased flow velocities may be secondary to anemia, thyrotoxicosis,  hyperdynamic or high flow state.    Recent Labs: 08/11/2019: Hemoglobin 15.0; Platelets 220.0 09/09/2019: ALT 26; BUN 19; Creatinine, Ser 1.32; Potassium 3.8; Sodium 139  Recent Lipid Panel    Component Value Date/Time   CHOL 187 09/09/2019 0818   TRIG 111 09/09/2019 0818   HDL 52 09/09/2019 0818   CHOLHDL 3.6 09/09/2019 0818   CHOLHDL 4 08/20/2018 0916   VLDL 15.8 08/20/2018 0916   LDLCALC 115 (H) 09/09/2019 0818   LDLDIRECT 145.0 06/25/2019 0916    Physical Exam:    VS:  BP (!) 143/80   Pulse 62   Ht 6\' 1"  (1.854 m)   Wt 260 lb 1.9 oz (118 kg)   SpO2 95%   BMI 34.32 kg/m     Wt Readings from Last 3 Encounters:  09/16/19 260 lb 1.9 oz (118 kg)  08/11/19 266 lb (120.7 kg)  07/27/19 (!) 272 lb (123.4 kg)     GEN: Patient is in no acute distress HEENT: Normal NECK: No JVD; No carotid bruits LYMPHATICS: No lymphadenopathy CARDIAC: Hear sounds regular, 2/6 systolic murmur at the apex. RESPIRATORY:  Clear to auscultation without rales, wheezing or rhonchi  ABDOMEN: Soft, non-tender,  non-distended MUSCULOSKELETAL:  No edema; No deformity  SKIN: Warm and dry NEUROLOGIC:  Alert and oriented x 3 PSYCHIATRIC:  Normal affect   Signed, 07/29/19, MD  09/16/2019 10:27 AM  [facility]

## 2019-09-16 NOTE — Patient Instructions (Signed)
Medication Instructions:  Your physician has recommended you make the following change in your medication:   Start Atorvastatin 10 mg daily. Take 81 mg coated aspirin daily.  *If you need a refill on your cardiac medications before your next appointment, please call your pharmacy*   Lab Work: Your physician recommends that you return for lab work in: 6 weeks (10/28/19). You need to have labs done when you are fasting.  You can come Monday through Friday 8:30 am to 12:00 pm and 1:15 to 4:30. You do not need to make an appointment as the order has already been placed. The labs you are going to have done are BMET, LFT and Lipids.   If you have labs (blood work) drawn today and your tests are completely normal, you will receive your results only by:  MyChart Message (if you have MyChart) OR  A paper copy in the mail If you have any lab test that is abnormal or we need to change your treatment, we will call you to review the results.   Testing/Procedures: None ordered   Follow-Up: At [facility], you and your health needs are our priority.  As part of our continuing mission to provide you with exceptional heart care, we have created designated Provider Care Teams.  These Care Teams include your primary Cardiologist (physician) and Advanced Practice Providers (APPs -  Physician Assistants and Nurse Practitioners) who all work together to provide you with the care you need, when you need it.  We recommend signing up for the patient portal called "MyChart".  Sign up information is provided on this After Visit Summary.  MyChart is used to connect with patients for Virtual Visits (Telemedicine).  Patients are able to view lab/test results, encounter notes, upcoming appointments, etc.  Non-urgent messages can be sent to your provider as well.   To learn more about what you can do with MyChart, go to [email or web address].    Your next appointment:   6 month(s)  The format for your next  appointment:   In Person  Provider:   Belva Crome, MD   Other Instructions Atorvastatin tablets What is this medicine? ATORVASTATIN (a TORE va sta tin) is known as a HMG-CoA reductase inhibitor or 'statin'. It lowers the level of cholesterol and triglycerides in the blood. This drug may also reduce the risk of heart attack, stroke, or other health problems in patients with risk factors for heart disease. Diet and lifestyle changes are often used with this drug. This medicine may be used for other purposes; ask your health care provider or pharmacist if you have questions. COMMON BRAND NAME(S): Lipitor What should I tell my health care provider before I take this medicine? They need to know if you have any of these conditions:  diabetes  if you often drink alcohol  history of stroke  kidney disease  liver disease  muscle aches or weakness  thyroid disease  an unusual or allergic reaction to atorvastatin, other medicines, foods, dyes, or preservatives  pregnant or trying to get pregnant  breast-feeding How should I use this medicine? Take this medicine by mouth with a glass of water. Follow the directions on the prescription label. You can take it with or without food. If it upsets your stomach, take it with food. Do not take with grapefruit juice. Take your medicine at regular intervals. Do not take it more often than directed. Do not stop taking except on your doctor's advice. Talk to your pediatrician regarding the use of  this medicine in children. While this drug may be prescribed for children as young as 10 for selected conditions, precautions do apply. Overdosage: If you think you have taken too much of this medicine contact a poison control center or emergency room at once. NOTE: This medicine is only for you. Do not share this medicine with others. What if I miss a dose? If you miss a dose, take it as soon as you can. If your next dose is to be taken in less than 12  hours, then do not take the missed dose. Take the next dose at your regular time. Do not take double or extra doses. What may interact with this medicine? Do not take this medicine with any of the following medications:  dasabuvir; ombitasvir; paritaprevir; ritonavir  ombitasvir; paritaprevir; ritonavir  posaconazole  red yeast rice This medicine may also interact with the following medications:  alcohol  birth control pills  certain antibiotics like erythromycin and clarithromycin  certain antivirals for HIV or hepatitis  certain medicines for cholesterol like fenofibrate, gemfibrozil, and niacin  certain medicines for fungal infections like ketoconazole and itraconazole  colchicine  cyclosporine  digoxin  grapefruit juice  rifampin This list may not describe all possible interactions. Give your health care provider a list of all the medicines, herbs, non-prescription drugs, or dietary supplements you use. Also tell them if you smoke, drink alcohol, or use illegal drugs. Some items may interact with your medicine. What should I watch for while using this medicine? Visit your doctor or health care professional for regular check-ups. You may need regular tests to make sure your liver is working properly. Your health care professional may tell you to stop taking this medicine if you develop muscle problems. If your muscle problems do not go away after stopping this medicine, contact your health care professional. Do not become pregnant while taking this medicine. Women should inform their health care professional if they wish to become pregnant or think they might be pregnant. There is a potential for serious side effects to an unborn child. Talk to your health care professional or pharmacist for more information. Do not breast-feed an infant while taking this medicine. This medicine may increase blood sugar. Ask your healthcare provider if changes in diet or medicines are needed  if you have diabetes. If you are going to need surgery or other procedure, tell your doctor that you are using this medicine. This drug is only part of a total heart-health program. Your doctor or a dietician can suggest a low-cholesterol and low-fat diet to help. Avoid alcohol and smoking, and keep a proper exercise schedule. This medicine may cause a decrease in Co-Enzyme Q-10. You should make sure that you get enough Co-Enzyme Q-10 while you are taking this medicine. Discuss the foods you eat and the vitamins you take with your health care professional. What side effects may I notice from receiving this medicine? Side effects that you should report to your doctor or health care professional as soon as possible:  allergic reactions like skin rash, itching or hives, swelling of the face, lips, or tongue  fever  joint pain  loss of memory  redness, blistering, peeling or loosening of the skin, including inside the mouth  signs and symptoms of high blood sugar such as being more thirsty or hungry or having to urinate more than normal. You may also feel very tired or have blurry vision.  signs and symptoms of liver injury like dark yellow or brown  urine; general ill feeling or flu-like symptoms; light-belly pain; unusually weak or tired; yellowing of the eyes or skin  signs and symptoms of muscle injury like dark urine; trouble passing urine or change in the amount of urine; unusually weak or tired; muscle pain or side or back pain Side effects that usually do not require medical attention (report to your doctor or health care professional if they continue or are bothersome):  diarrhea  nausea  stomach pain  trouble sleeping  upset stomach This list may not describe all possible side effects. Call your doctor for medical advice about side effects. You may report side effects to FDA at 1-800-FDA-1088. Where should I keep my medicine? Keep out of the reach of children. Store between  20 and 25 degrees C (68 and 77 degrees F). Throw away any unused medicine after the expiration date. NOTE: This sheet is a summary. It may not cover all possible information. If you have questions about this medicine, talk to your doctor, pharmacist, or health care provider.  2020 Elsevier/Gold Standard (2017-10-09 11:36:16)   Aspirin and Your Heart  Aspirin is a medicine that prevents the cells in the blood that are used for clotting, called platelets, from sticking together. Aspirin can be used to help reduce the risk of blood clots, heart attacks, and other heart-related problems. Can I take aspirin? Your health care provider will help you determine whether it is safe and beneficial for you to take aspirin daily. Taking aspirin daily may be helpful if you:  Have had a heart attack or chest pain.  Are at risk for a heart attack.  Have undergone open-heart surgery, such as coronary artery bypass surgery (CABG).  Have had coronary angioplasty or a stent.  Have had certain types of stroke or transient ischemic attack (TIA).  Have peripheral artery disease (PAD).  Have chronic heart rhythm problems such as atrial fibrillation and cannot take an anticoagulant.  Have valve disease or have had surgery on a valve. What are the risks? Daily use of aspirin can cause side effects. Some of these include:  Bleeding. Bleeding problems can be minor or serious. An example of a minor problem is a cut that does not stop bleeding. An example of a more serious problem is stomach bleeding or, rarely, bleeding into the brain. Your risk of bleeding is increased if you are also taking non-steroidal anti-inflammatory drugs (NSAIDs).  Increased bruising.  Upset stomach.  An allergic reaction. People who have nasal polyps have an increased risk of developing an aspirin allergy. General guidelines  Take aspirin only as told by your health care provider. Make sure that you understand how much you should  take and what form you should take. The two forms of aspirin are: ? Non-enteric-coated.This type of aspirin does not have a coating and is absorbed quickly. This type of aspirin also comes in a chewable form. ? Enteric-coated. This type of aspirin has a coating that releases the medicine very slowly. Enteric-coated aspirin might cause less stomach upset than non-enteric-coated aspirin. This type of aspirin should not be chewed or crushed.  Limit alcohol intake to no more than 1 drink a day for nonpregnant women and 2 drinks a day for men. Drinking alcohol increases your risk of bleeding. One drink equals 12 oz of beer, 5 oz of wine, or 1 oz of hard liquor. Contact a health care provider if you:  Have unusual bleeding or bruising.  Have stomach pain or nausea.  Have ringing in  your ears.  Have an allergic reaction that causes: ? Hives. ? Itchy skin. ? Swelling of the lips, tongue, or face. Get help right away if you:  Notice that your bowel movements are bloody, dark red, or black in color.  Vomit or cough up blood.  Have blood in your urine.  Cough, have noisy breathing (wheeze), or feel short of breath.  Have chest pain, especially if the pain spreads to the arms, back, neck, or jaw.  Have a severe headache, or a headache with confusion, or dizziness. These symptoms may represent a serious problem that is an emergency. Do not wait to see if the symptoms will go away. Get medical help right away. Call your local emergency services (911 in the [place] Do not drive yourself to the hospital. Summary  Aspirin can be used to help reduce the risk of blood clots, heart attacks, and other heart-related problems.  Daily use of aspirin can increase your risk of side effects. Your health care provider will help you determine whether it is safe and beneficial for you to take aspirin daily.  Take aspirin only as told by your health care provider. Make sure that you understand how much you can  take and what form you can take. This information is not intended to replace advice given to you by your health care provider. Make sure you discuss any questions you have with your health care provider. Document Revised: 10/18/2016 Document Reviewed: 10/18/2016 Elsevier Patient Education  2020 [employer]

## 2019-09-16 NOTE — Addendum Note (Signed)
Addended by: Eleonore Chiquito on: 09/16/2019 10:49 AM   Modules accepted: Orders

## 2019-10-09 ENCOUNTER — Other Ambulatory Visit: Payer: Self-pay | Admitting: Family Medicine

## 2019-10-09 DIAGNOSIS — I10 Essential (primary) hypertension: Secondary | ICD-10-CM

## 2019-10-09 MED ORDER — LOSARTAN POTASSIUM 100 MG PO TABS: 100.0000 mg | ORAL_TABLET | Freq: Every day | ORAL | 0 refills | Status: DC

## 2019-10-26 ENCOUNTER — Other Ambulatory Visit: Payer: Self-pay

## 2019-10-26 ENCOUNTER — Ambulatory Visit: Payer: Medicare Other | Admitting: Dermatology

## 2019-10-26 DIAGNOSIS — L818 Other specified disorders of pigmentation: Secondary | ICD-10-CM | POA: Diagnosis not present

## 2019-10-26 DIAGNOSIS — Z1283 Encounter for screening for malignant neoplasm of skin: Secondary | ICD-10-CM

## 2019-10-26 DIAGNOSIS — L720 Epidermal cyst: Secondary | ICD-10-CM | POA: Diagnosis not present

## 2019-10-26 NOTE — Patient Instructions (Addendum)
First visit for Marcus Diaz, [place]. date of birth [birth year].  Over months he has noticed the development of some light spots, the largest one being on the left upper forehead with smaller ones on the front of the scalp.  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  Look-alike's could include early vitiligo and tinea versicolor but I really do not think that scraping or biopsy is currently indicated.  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  I also did a general skin check from the waist up and there were no atypical moles or melanoma or nonmole skin cancer.  A 35+ year old firm 3+ cm subcutaneous nodule on the right lower deltoid could be either a fibrotic skin cyst or a fibrolipoma.  As long as it is clinically stable it does not require biopsy or excision.  All questions were addressed and follow-up can be on an as-needed basis.

## 2019-10-28 ENCOUNTER — Encounter: Payer: Self-pay | Admitting: Dermatology

## 2019-10-28 NOTE — Progress Notes (Signed)
   New Patient   Subjective  Marcus Diaz. is a 49 y.o. male who presents for the following: Skin Problem (Check discoloration areas on patient scalp. x 1 year. No symptoms. ).  Light spots Location: Scalp Duration: 1 year Quality:  Associated Signs/Symptoms: Modifying Factors:  Severity:  Timing: Context:    The following portions of the chart were reviewed this encounter and updated as appropriate: Tobacco  Allergies  Meds  Problems  Med Hx  Surg Hx  Fam Hx      Objective  Well appearing patient in no apparent distress; mood and affect are within normal limits.  All skin waist up examined.   Assessment & Plan  Idiopathic guttate hypomelanosis (2) Left Forehead; Right Forehead  Benign lesions no treatment is needed.  Differential would include postinflammatory dyschromia and.  Patient not concerned about cosmetic appearance so no intervention planned.  Epidermal cyst Right Upper Arm - Anterior  Lesion is benign and does not need treatment at this time unless patient wants it removed.   Screening for malignant neoplasm of skin Mid Back  Yearly skin exams.   First visit for Marcus Diaz, Marcus Diaz. date of birth 01/24/1970.  Over months he has noticed the development of some light spots, the largest one being on the left upper forehead with smaller ones on the front of the scalp.  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  Look-alike's could include early vitiligo and tinea versicolor but I really do not think that scraping or biopsy is currently indicated.  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  I also did a general skin check from the waist up and there were no atypical moles or melanoma or nonmole skin cancer.  A 46+ year old firm 3+ cm subcutaneous nodule on the right lower deltoid could be either a fibrotic skin  cyst or a fibrolipoma.  As long as it is clinically stable it does not require biopsy or excision.  All questions were addressed and follow-up can be on an as-needed basis.

## 2019-10-30 ENCOUNTER — Ambulatory Visit: Payer: Medicare Other | Admitting: Gastroenterology

## 2019-10-30 ENCOUNTER — Encounter: Payer: Self-pay | Admitting: Gastroenterology

## 2019-10-30 VITALS — BP 142/80 | HR 77 | Ht 73.0 in | Wt 263.0 lb

## 2019-10-30 DIAGNOSIS — K219 Gastro-esophageal reflux disease without esophagitis: Secondary | ICD-10-CM | POA: Diagnosis not present

## 2019-10-30 DIAGNOSIS — Z800 Family history of malignant neoplasm of digestive organs: Secondary | ICD-10-CM | POA: Diagnosis not present

## 2019-10-30 MED ORDER — NA SULFATE-K SULFATE-MG SULF 17.5-3.13-1.6 GM/177ML PO SOLN: 1.0000 | Freq: Once | ORAL | 0 refills | Status: AC

## 2019-10-30 MED ORDER — NA SULFATE-K SULFATE-MG SULF 17.5-3.13-1.6 GM/177ML PO SOLN
1.0000 | Freq: Once | ORAL | 0 refills | Status: DC
Start: 2019-10-30 — End: 2019-10-30

## 2019-10-30 NOTE — Patient Instructions (Addendum)
If you are age 51 or younger, your body mass index should be between 19-25. Your Body mass index is 34.7 kg/m. If this is out of the aformentioned range listed, please consider follow up with your Primary Care Provider.   COLONOSCOPY and ENDOSCOPY: You have been scheduled for a colonoscopy AND endoscopy. Please follow written instructions given to you at your visit today.   PREP: Please pick up your prep supplies at the pharmacy within the next 1-3 days.  INHALERS: If you use inhalers (even only as needed), please bring them with you on the day of your procedure.  I have recommended a screening colonoscopy and a screening upper endoscopy.   HEALTHCARE LAWS AND MY CHART RESULTS: Due to recent changes in healthcare laws, you may see the results of your imaging and laboratory studies on MyChart before your provider has had a chance to review them.  We understand that in some cases there may be results that are confusing or concerning to you. Not all laboratory results come back in the same time frame and the provider may be waiting for multiple results in order to interpret others.  Please give [place] 48 hours in order for your provider to thoroughly review all the results before contacting the office for clarification of your results.   Tips for colonoscopy:  - Stay well hydrated for 3-4 days prior to the exam. This reduces nausea and dehydration.  - To prevent skin/hemorrhoid irritation - prior to wiping, put A&Dointment or vaseline on the toilet paper. - Keep a towel or pad on the bed.  - Drink  64oz of clear liquids in the morning of prep day (prior to starting the prep) to be sure that there is enough fluid to flush the colon and stay hydrated!!!! This is in addition to the fluids required for preparation. - Use of a flavored hard candy, such as grape Anise Salvo, can counteract some of the flavor of the prep and may prevent some nausea.   Tips for your reflux: Modifying diet and lifestyle  remains the foundation for treating the symptoms of reflux.   The following strategies help you prevent heartburn and other symptoms by avoiding foods that reduce the effectiveness of the bottom of the esophagus from protecting the esophagus from from acid injury and keeping stomach contents where they belong.  Eat smaller meals. A large meal remains in the stomach for several hours, increasing the chances for gastroesophageal reflux. Try distributing your daily food intake over three, four, or five smaller meals.  Relax when you eat. Stress increases the production of stomach acid, so make meals a pleasant, relaxing experience. Sit down. Eat slowly. Chew completely. Play soothing music.  Relax between meals. Relaxation therapies such as deep breathing, meditation, massage, tai chi, or yoga may help prevent and relieve heartburn.   Remain upright after eating. You should maintain postures that reduce the risk for reflux for at least three hours after eating. For example, don't bend over or strain to lift heavy objects.  Avoid eating within three hours of going to bed. Lying down after eating will increase chances of reflux.  Lose weight. Excess pounds increase pressure on the stomach and can push acid into the esophagus.  Loosen up. Avoid tight belts, waistbands, and other clothing that puts pressure on your stomach.  Avoid foods that burn. Abstain from food or drink that increases gastric acid secretion, decreases the valve at the bottom of the esophagus, or slows the emptying of the  stomach. Known offenders include high-fat foods, spicy dishes, tomatoes and tomato products, citrus fruits, garlic, onions, milk, carbonated drinks, coffee (including decaf), tea, chocolate, mints, and alcohol.  Avoid medications that can predispose you to reflux including aspirin and other NSAIDs, oral contraceptives, hormone therapy drugs, and certain antidepressants.  Sleep at an angle. If you're bothered by  nighttime heartburn, place a wedge (available in medical supply stores or a wedge pillow through [employer]) under your upper body. But don't elevate your head with extra pillows. That makes reflux worse by bending you at the waist and compressing your stomach. You might also try sleeping on your left side, as studies have shown this can help--perhaps because the stomach is on the left side of the body, so lying on your left positions most of the stomach below the bottom of the esophagus.  Thank you for trusting me with your gastrointestinal care!    Thornton Park, MD, MPH

## 2019-10-30 NOTE — Progress Notes (Signed)
Referring Provider: Mliss Sax,* Primary Care Physician:  Mliss Sax, MD  Reason for Consultation:  Colonoscopy   IMPRESSION:  GERD controlled on Nexium    - history of EGD with dilation 2015 for dysphagia, biopsies showed reflux esophagitis    - esophageal biopsies in 2015 negative for EOE and Barrett's BMI 34.7 Father with esophageal cancer at age 51 No prior colon cancer screening  GERD: Although symptoms are controlled on PPI, patient has several risk factors for Barrett's Esophagus (sex, age, BMI, family history).  Last EGD 6 years ago.  No prior colon cancer screening: Colonoscopy recommended for colon cancer screening.   PLAN: - Reviewed lifestyle modifications as these may also reduce his risk for Barrett's Esophagus - Continue PPI therapy for symptomatic control - EGD to screen for Barrett's Esophagus - Screening colonoscopy  The nature of the procedure, as well as the risks, benefits, and alternatives were carefully and thoroughly reviewed with the patient. Ample time for discussion and questions allowed. The patient understood, was satisfied, and agreed to proceed.  Please see the "Patient Instructions" section for addition details about the plan.  HPI: Marcus Diaz. is a 49 y.o. male referred by Dr. Doreene Burke. The history is obtained through the patient, review of his electronic health record, and review of records from [facility].   He reports a longstanding history of reflux, previously evaluated in [place].  EGD 03/20/13 for dysphagia: irregular zline. Dilation performed with 40mm savory despite normal esophagus.  Esophageal biopsies showed reflux esophagitis.  No eosinophilic esophagitis or Barrett's esophagus.  Mild antral gastritis. Normal duodenum.   Symptoms controlled on Nexium daily. Symptoms recur after about 2-3 missed doses. No evidence for GI bleeding, iron deficiency anemia, anorexia, unexplained weight loss, dysphagia,  odynophagia, persistent vomiting, or gastrointestinal cancer in a first-degree relative.  No prior colon cancer screening.    Father with esophageal cancer at age 43. Maternal grandfather had colon cancer or prostate cancer. No other known family history of colon cancer or polyps. No family history of uterine/endometrial cancer, pancreatic cancer or gastric/stomach cancer.   Past Medical History:  Diagnosis Date  . Backache 12/15/2009  . Bicuspid aortic valve 12/15/2009   Has seen dr. Rennis Harding, due for echocardiogram  Formatting of this note might be different from the original. Has seen dr. Rennis Harding, due for echocardiogram  . Cervical radiculopathy 12/12/2010   Formatting of this note might be different from the original. Surgery C4/5/6 dr. Angelene Giovanni Oct 2012  . Dysfunction of left eustachian tube 05/25/2019  . Dysphagia 02/12/2013  . Elevated glucose 08/11/2019  . Elevated LFTs 08/11/2019  . Erectile dysfunction due to arterial insufficiency 04/20/2019  . Essential hypertension 12/15/2009   Cont with atenolol 50 mg daily bid  Lisinopril 20 mg daily     Last Assessment & Plan:  Bicuspid aortic valve will refer to cardiology/  Will check 24 hour holter as he has had some palpitations.  . Family history of diabetes mellitus 08/21/2010  . Foot pain, right 12/02/2015  . Gastritis 03/20/2013  . Gastroesophageal reflux disease without esophagitis 02/12/2013  . Heart murmur   . History of cardiovascular disorder 08/21/2010  . HTN (hypertension) 12/15/2009   Formatting of this note might be different from the original.   Cont with atenolol 50 mg daily bid  Lisinopril 20 mg daily     Last Assessment & Plan:  Formatting of this note might be different from the original. Bicuspid aortic valve will refer to  cardiology/  Will check 24 hour holter as he has had some palpitations.  . Hypercholesterolemia 12/15/2009   Cont to monitor with diet.  Formatting of this note might be different from the original. Cont to  monitor with diet.  . Hyperglycemia 12/15/2009  . Hypertension   . Hypokalemia 08/13/2019  . Irregular Z line of esophagus 03/20/2013  . Joint pain of ankle and foot 12/16/2009  . Lumbar disc disease 02/13/2012  . MDD (major depressive disorder), recurrent, in full remission (HCC) 09/11/2012  . Medial epicondylitis, left 12/16/2009  . Morbid obesity (HCC) 06/25/2019  . Onychomycosis 10/18/2015  . 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 Preventative health care 12/16/2009  . Shoulder pain, right 12/15/2009  . Sleep apnea, obstructive 12/15/2009  . SOB (shortness of breath) 12/15/2009  . Testosterone deficiency 05/04/2010  . 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 ANKLE SURGERY Right    reconstructed  . neck fused    . SHOULDER SURGERY     X3  . ULNAR NERVE REPAIR      Current Outpatient Medications  Medication Sig Dispense Refill  . amLODipine (NORVASC) 5 MG tablet Take 1 tablet (5 mg total) by mouth daily. 90 tablet 3  . aspirin EC 81 MG tablet Take 1 tablet (81 mg total) by mouth daily. Swallow whole. 90 tablet 3  . atorvastatin (LIPITOR) 10 MG tablet Take 1 tablet (10 mg total) by mouth daily. 90 tablet 3  . chlorthalidone (HYGROTON) 25 MG tablet Take 1 tablet (25 mg total) by mouth daily. 90 tablet 0  . esomeprazole (NEXIUM) 20 MG capsule Take 20 mg by mouth daily at 12 noon.    Marland Kitchen losartan (COZAAR) 100 MG tablet Take 1 tablet (100 mg total) by mouth daily. 90 tablet 0  . naproxen sodium (ALEVE) 220 MG tablet Take 220 mg by mouth as needed.    . potassium chloride SA (KLOR-CON) 20 MEQ tablet Take 1 tablet (20 mEq total) by mouth daily. 90 tablet 1  . tadalafil (CIALIS) 20 MG tablet Take 0.5-1 tablets (10-20 mg total) by mouth every other day as needed for erectile dysfunction. 5 tablet 11   No current facility-administered medications for this visit.    Allergies as of 10/30/2019  . (No Known Allergies)     Family History  Problem Relation Age of Onset  . Lung disease Mother   . Cancer Father        esophageal cancer  . Cancer Maternal Grandfather        colon  . Cancer Paternal Grandmother        stomach    Social History   Socioeconomic History  . Marital status: Married    Spouse name: Not on file  . Number of children: Not on file  . Years of education: Not on file  . Highest education level: Not on file  Occupational History  . Not on file  Tobacco Use  . Smoking status: Never Smoker  . Smokeless tobacco: Never Used  Vaping Use  . Vaping Use: Never used  Substance and Sexual Activity  . Alcohol use: Yes    Comment: rarely  . Drug use: Never  . Sexual activity: Not on file  Other Topics Concern  . Not on file  Social History Narrative  . Not on file   Social Determinants of Health   Financial Resource Strain:   . Difficulty of Paying Living Expenses:  Not on file  Food Insecurity:   . Worried About Programme researcher, broadcasting/film/video in the Last Year: Not on file  . Ran Out of Food in the Last Year: Not on file  Transportation Needs:   . Lack of Transportation (Medical): Not on file  . Lack of Transportation (Non-Medical): Not on file  Physical Activity:   . Days of Exercise per Week: Not on file  . Minutes of Exercise per Session: Not on file  Stress:   . Feeling of Stress : Not on file  Social Connections:   . Frequency of Communication with Friends and Family: Not on file  . Frequency of Social Gatherings with Friends and Family: Not on file  . Attends Religious Services: Not on file  . Active Member of Clubs or Organizations: Not on file  . Attends Banker Meetings: Not on file  . Marital Status: Not on file  Intimate Partner Violence:   . Fear of Current or Ex-Partner: Not on file  . Emotionally Abused: Not on file  . Physically Abused: Not on file  . Sexually Abused: Not on file    Review of Systems: 12 system ROS is negative except as noted  above with the addition of arthritis, hearing problems, and heart murmur.   Physical Exam: General:   Alert,  well-nourished, pleasant and cooperative in NAD Head:  Normocephalic and atraumatic. Eyes:  Sclera clear, no icterus.   Conjunctiva pink. Ears:  Normal auditory acuity. Nose:  No deformity, discharge,  or lesions. Mouth:  No deformity or lesions.   Neck:  Supple; no masses or thyromegaly. Lungs:  Clear throughout to auscultation.   No wheezes. Heart:  Regular rate and rhythm; no murmurs. Abdomen:  Soft, central obesity, nontender, nondistended, normal bowel sounds, no rebound or guarding. No hepatosplenomegaly.   Rectal:  Deferred  Msk:  Symmetrical. No boney deformities LAD: No inguinal or umbilical LAD Extremities:  No clubbing or edema. Neurologic:  Alert and  oriented x4;  grossly nonfocal Skin:  Intact without significant lesions or rashes. Psych:  Alert and cooperative. Normal mood and affect.     Chandler Swiderski L. Orvan Falconer, MD, MPH 10/30/2019, 2:07 PM

## 2019-11-04 ENCOUNTER — Ambulatory Visit: Payer: Medicare Other | Admitting: Cardiology

## 2019-11-12 ENCOUNTER — Other Ambulatory Visit: Payer: Self-pay | Admitting: Family Medicine

## 2019-11-12 DIAGNOSIS — I10 Essential (primary) hypertension: Secondary | ICD-10-CM

## 2019-11-12 MED ORDER — CHLORTHALIDONE 25 MG PO TABS: 25.0000 mg | ORAL_TABLET | Freq: Every day | ORAL | 1 refills | Status: AC

## 2019-11-24 DIAGNOSIS — Z79899 Other long term (current) drug therapy: Secondary | ICD-10-CM | POA: Diagnosis not present

## 2019-11-24 DIAGNOSIS — E7800 Pure hypercholesterolemia, unspecified: Secondary | ICD-10-CM | POA: Diagnosis not present

## 2019-11-24 DIAGNOSIS — I10 Essential (primary) hypertension: Secondary | ICD-10-CM | POA: Diagnosis not present

## 2019-11-25 LAB — HEPATIC FUNCTION PANEL
ALT: 26 IU/L (ref 0–44)
AST: 30 IU/L (ref 0–40)
Albumin: 4.9 g/dL (ref 3.8–4.9)
Alkaline Phosphatase: 80 IU/L (ref 44–121)
Bilirubin Total: 0.5 mg/dL (ref 0.0–1.2)
Bilirubin, Direct: 0.15 mg/dL (ref 0.00–0.40)
Total Protein: 7.8 g/dL (ref 6.0–8.5)

## 2019-11-25 LAB — BASIC METABOLIC PANEL
BUN/Creatinine Ratio: 12 (ref 9–20)
BUN: 14 mg/dL (ref 6–24)
CO2: 28 mmol/L (ref 20–29)
Calcium: 10.2 mg/dL (ref 8.7–10.2)
Chloride: 99 mmol/L (ref 96–106)
Creatinine, Ser: 1.17 mg/dL (ref 0.76–1.27)
GFR calc Af Amer: 83 mL/min/{1.73_m2} (ref >59)
GFR calc non Af Amer: 72 mL/min/{1.73_m2} (ref >59)
Glucose: 97 mg/dL (ref 65–99)
Potassium: 3.6 mmol/L (ref 3.5–5.2)
Sodium: 140 mmol/L (ref 134–144)

## 2019-11-25 LAB — LIPID PANEL
Chol/HDL Ratio: 2.7 ratio (ref 0.0–5.0)
Cholesterol, Total: 151 mg/dL (ref 100–199)
HDL: 56 mg/dL (ref >39)
LDL Chol Calc (NIH): 80 mg/dL (ref 0–99)
Triglycerides: 77 mg/dL (ref 0–149)
VLDL Cholesterol Cal: 15 mg/dL (ref 5–40)

## 2019-12-08 ENCOUNTER — Other Ambulatory Visit: Payer: Self-pay

## 2019-12-08 ENCOUNTER — Ambulatory Visit (AMBULATORY_SURGERY_CENTER): Payer: Medicare Other | Admitting: Gastroenterology

## 2019-12-08 ENCOUNTER — Encounter: Payer: Self-pay | Admitting: Gastroenterology

## 2019-12-08 VITALS — BP 105/60 | HR 61 | Temp 97.1°F | Resp 12 | Ht 72.0 in | Wt 263.0 lb

## 2019-12-08 DIAGNOSIS — K2970 Gastritis, unspecified, without bleeding: Secondary | ICD-10-CM

## 2019-12-08 DIAGNOSIS — Z800 Family history of malignant neoplasm of digestive organs: Secondary | ICD-10-CM

## 2019-12-08 DIAGNOSIS — K6289 Other specified diseases of anus and rectum: Secondary | ICD-10-CM

## 2019-12-08 DIAGNOSIS — Z1211 Encounter for screening for malignant neoplasm of colon: Secondary | ICD-10-CM | POA: Diagnosis not present

## 2019-12-08 DIAGNOSIS — K2950 Unspecified chronic gastritis without bleeding: Secondary | ICD-10-CM | POA: Diagnosis not present

## 2019-12-08 DIAGNOSIS — K5989 Other specified functional intestinal disorders: Secondary | ICD-10-CM | POA: Diagnosis not present

## 2019-12-08 DIAGNOSIS — K317 Polyp of stomach and duodenum: Secondary | ICD-10-CM | POA: Diagnosis not present

## 2019-12-08 DIAGNOSIS — K219 Gastro-esophageal reflux disease without esophagitis: Secondary | ICD-10-CM | POA: Diagnosis not present

## 2019-12-08 MED ORDER — SODIUM CHLORIDE 0.9 % IV SOLN: 500.0000 mL | Freq: Once | INTRAVENOUS | Status: DC

## 2019-12-08 NOTE — Op Note (Addendum)
[facility] Patient Name: Marcus Diaz Procedure Date: 12/08/2019 3:18 PM MRN: [id number] Endoscopist: Tressia Danas MD, MD Age: 49 Referring MD:  Date of Birth: 1970-05-10 Gender: Male Account #: [id number] Procedure:                Colonoscopy Indications:              Screening for colorectal malignant neoplasm, This                            is the patient's first colonoscopy                           Maternal grandfather had colon cancer or prostate                            cancer                           No other known family history of colon cancer or                            polyps Medicines:                Monitored Anesthesia Care Procedure:                Pre-Anesthesia Assessment:                           - Prior to the procedure, a History and Physical                            was performed, and patient medications and                            allergies were reviewed. The patient's tolerance of                            previous anesthesia was also reviewed. The risks                            and benefits of the procedure and the sedation                            options and risks were discussed with the patient.                            All questions were answered, and informed consent                            was obtained. Prior Anticoagulants: The patient has                            taken no previous anticoagulant or antiplatelet                            agents. ASA Grade  Assessment: III - A patient with                            severe systemic disease. After reviewing the risks                            and benefits, the patient was deemed in                            satisfactory condition to undergo the procedure.                           After obtaining informed consent, the colonoscope                            was passed under direct vision. Throughout the                            procedure, the patient's blood  pressure, pulse, and                            oxygen saturations were monitored continuously. The                            Colonoscope was introduced through the anus and                            advanced to the 3 cm into the ileum. The                            colonoscopy was performed without difficulty. The                            patient tolerated the procedure well. The quality                            of the bowel preparation was good. The terminal                            ileum, ileocecal valve, appendiceal orifice, and                            rectum were photographed. Scope In: 3:37:40 PM Scope Out: 3:50:56 PM Scope Withdrawal Time: 0 hours 10 minutes 56 seconds  Total Procedure Duration: 0 hours 13 minutes 16 seconds  Findings:                 The perianal and digital rectal examinations were                            normal.                           A patchy area of mildly erythematous mucosa was  found in the distal sigmoid colon. Biopsies were                            taken with a cold forceps for histology. Estimated                            blood loss was minimal.                           The exam was otherwise without abnormality on                            direct and retroflexion views except for internal                            hemorrhoids. Complications:            No immediate complications. Estimated blood loss:                            Minimal. Estimated Blood Loss:     Estimated blood loss was minimal. Impression:               - Small internal hemorrhoids.                           - Erythematous mucosa in the distal sigmoid colon.                            Findings may be related to prep artifact. Biopsied.                           - The examination was otherwise normal on direct                            and retroflexion views. Recommendation:           - Patient has a contact number available for                             emergencies. The signs and symptoms of potential                            delayed complications were discussed with the                            patient. Return to normal activities tomorrow.                            Written discharge instructions were provided to the                            patient.                           - Resume previous diet.                           -  Continue present medications.                           - Await pathology results.                           - Repeat colonoscopy in 10 years for screening                            purposes, earlier with any new symptoms.                           - Emerging evidence supports eating a diet of                            fruits, vegetables, grains, calcium, and yogurt                            while reducing red meat and alcohol may reduce the                            risk of colon cancer.                           - Thank you for allowing me to be involved in your                            colon cancer prevention. Tressia Danas MD, MD 12/08/2019 4:04:14 PM This report has been signed electronically.

## 2019-12-08 NOTE — Addendum Note (Signed)
Addended by: Dulce Sellar on: 12/08/2019 03:51 PM   Modules accepted: Orders

## 2019-12-08 NOTE — Op Note (Signed)
[facility] Patient Name: Marcus Diaz Procedure Date: 12/08/2019 3:18 PM MRN: [id number] Endoscopist: Tressia Danas MD, MD Age: 49 Referring MD:  Date of Birth: 10/17/1970 Gender: Male Account #: [id number] Procedure:                Upper GI endoscopy Indications:              Screening for Barrett's esophagus in patient at                            risk for this condition, Family history of                            esophageal cancer (father at age 34) Medicines:                Monitored Anesthesia Care Procedure:                Pre-Anesthesia Assessment:                           - Prior to the procedure, a History and Physical                            was performed, and patient medications and                            allergies were reviewed. The patient's tolerance of                            previous anesthesia was also reviewed. The risks                            and benefits of the procedure and the sedation                            options and risks were discussed with the patient.                            All questions were answered, and informed consent                            was obtained. Prior Anticoagulants: The patient has                            taken no previous anticoagulant or antiplatelet                            agents. ASA Grade Assessment: III - A patient with                            severe systemic disease. After reviewing the risks                            and benefits, the patient was deemed in  satisfactory condition to undergo the procedure.                           After obtaining informed consent, the endoscope was                            passed under direct vision. Throughout the                            procedure, the patient's blood pressure, pulse, and                            oxygen saturations were monitored continuously. The                            Endoscope was introduced  through the mouth, and                            advanced to the third part of duodenum. The upper                            GI endoscopy was accomplished without difficulty.                            The patient tolerated the procedure well. Scope In: Scope Out: Findings:                 The Z-line was irregular, congested, and located 38                            cm from the incisors. There was a single island.                            Biopsies were taken with a cold forceps for                            histology. Estimated blood loss was minimal.                           Multiple small sessile polyps were found in the                            gastric body. These have the appearance of fundic                            gland polyps. Biopsies were taken with a cold                            forceps for histology. Estimated blood loss was                            minimal.  Diffuse minimal inflammation characterized by                            erythema, friability and granularity was found in                            the gastric body. Biopsies were taken from the                            antrum, body, and fundus with a cold forceps for                            histology. Estimated blood loss was minimal.                           Patchy mildly erythematous mucosa without active                            bleeding was found in the duodenal bulb.                           The cardia and gastric fundus were normal on                            retroflexion.                           The exam was otherwise without abnormality. Complications:            No immediate complications. Estimated blood loss:                            Minimal. Estimated Blood Loss:     Estimated blood loss was minimal. Impression:               - Z-line irregular, 38 cm from the incisors.                            Biopsied.                           - Multiple gastric  polyps. Biopsied.                           - Gastritis. Biopsied.                           - Erythematous duodenopathy.                           - The examination was otherwise normal. Recommendation:           - Patient has a contact number available for                            emergencies. The signs and symptoms of potential  delayed complications were discussed with the                            patient. Return to normal activities tomorrow.                            Written discharge instructions were provided to the                            patient.                           - Resume previous diet.                           - Continue present medications including Nexium 20                            mg QAM.                           - No aspirin, ibuprofen, naproxen, or other                            non-steroidal anti-inflammatory drugs.                           - Await pathology results. Tressia Danas MD, MD 12/08/2019 3:59:01 PM This report has been signed electronically.

## 2019-12-08 NOTE — Progress Notes (Signed)
To PACU, VSS. Report to [email or web address] 

## 2019-12-08 NOTE — Patient Instructions (Signed)
YOU HAD AN ENDOSCOPIC PROCEDURE TODAY AT THE [facility]:   Refer to the procedure report that was given to you for any specific questions about what was found during the examination.  If the procedure report does not answer your questions, please call your gastroenterologist to clarify.  If you requested that your care partner not be given the details of your procedure findings, then the procedure report has been included in a sealed envelope for you to review at your convenience later.  YOU SHOULD EXPECT: Some feelings of bloating in the abdomen. Passage of more gas than usual.  Walking can help get rid of the air that was put into your GI tract during the procedure and reduce the bloating. If you had a lower endoscopy (such as a colonoscopy or flexible sigmoidoscopy) you may notice spotting of blood in your stool or on the toilet paper. If you underwent a bowel prep for your procedure, you may not have a normal bowel movement for a few days.  Please Note:  You might notice some irritation and congestion in your nose or some drainage.  This is from the oxygen used during your procedure.  There is no need for concern and it should clear up in a day or so.  SYMPTOMS TO REPORT IMMEDIATELY:   Following lower endoscopy (colonoscopy or flexible sigmoidoscopy):  Excessive amounts of blood in the stool  Significant tenderness or worsening of abdominal pains  Swelling of the abdomen that is new, acute  Fever of 100F or higher   Following upper endoscopy (EGD)  Vomiting of blood or coffee ground material  New chest pain or pain under the shoulder blades  Painful or persistently difficult swallowing  New shortness of breath  Fever of 100F or higher  Black, tarry-looking stools  For urgent or emergent issues, a gastroenterologist can be reached at any hour by calling [phone]. Do not use MyChart messaging for urgent concerns.    DIET:  We do recommend a small meal at first, but  then you may proceed to your regular diet.  Drink plenty of fluids but you should avoid alcoholic beverages for 24 hours.  MEDICATIONS: Continue present medications. No Aspirin, Ibuprofen, Naproxen, or other non-steroidal anti-inflammatory drugs. Continue Nexium 20 mg by mouth every am.  Please see handouts given to you by your recovery nurse.  ACTIVITY:  You should plan to take it easy for the rest of today and you should NOT DRIVE or use heavy machinery until tomorrow (because of the sedation medicines used during the test).    FOLLOW UP: Our staff will call the number listed on your records 48-72 hours following your procedure to check on you and address any questions or concerns that you may have regarding the information given to you following your procedure. If we do not reach you, we will leave a message.  We will attempt to reach you two times.  During this call, we will ask if you have developed any symptoms of COVID 19. If you develop any symptoms (ie: fever, flu-like symptoms, shortness of breath, cough etc.) before then, please call [phone].  If you test positive for Covid 19 in the 2 weeks post procedure, please call and report this information to [place].    If any biopsies were taken you will be contacted by phone or by letter within the next 1-3 weeks.  Please call us at [phone] if you have not heard about the biopsies in 3 weeks.  Thank you for allowing [place] to provide for your healthcare needs today.   SIGNATURES/CONFIDENTIALITY: You and/or your care partner have signed paperwork which will be entered into your electronic medical record.  These signatures attest to the fact that that the information above on your After Visit Summary has been reviewed and is understood.  Full responsibility of the confidentiality of this discharge information lies with you and/or your care-partner.

## 2019-12-10 ENCOUNTER — Telehealth: Payer: Self-pay

## 2019-12-10 NOTE — Telephone Encounter (Signed)
  Follow up Call-  Call back number 12/08/2019  Post procedure Call Back phone  # [phone]  Permission to leave phone message Yes  Some recent data might be hidden     Patient questions:  Do you have a fever, pain , or abdominal swelling? No. Pain Score  0 *  Have you tolerated food without any problems? Yes.    Have you been able to return to your normal activities? Yes.    Do you have any questions about your discharge instructions: Diet   No. Medications  No. Follow up visit  No.  Do you have questions or concerns about your Care? No.  Actions: * If pain score is 4 or above: 1. No action needed, pain <4.Have you developed a fever since your procedure? no  2.   Have you had an respiratory symptoms (SOB or cough) since your procedure? no  3.   Have you tested positive for COVID 19 since your procedure no  4.   Have you had any family members/close contacts diagnosed with the COVID 19 since your procedure?  no   If yes to any of these questions please route to Laverna Peace, RN and Karlton Lemon, RN

## 2019-12-22 ENCOUNTER — Other Ambulatory Visit: Payer: Self-pay

## 2019-12-22 MED ORDER — ESOMEPRAZOLE MAGNESIUM 40 MG PO CPDR
40.0000 mg | DELAYED_RELEASE_CAPSULE | Freq: Every day | ORAL | 3 refills | Status: AC
Start: 2019-12-22 — End: ?

## 2020-01-08 ENCOUNTER — Telehealth: Payer: Self-pay | Admitting: Family Medicine

## 2020-01-08 DIAGNOSIS — I10 Essential (primary) hypertension: Secondary | ICD-10-CM

## 2020-01-08 MED ORDER — LOSARTAN POTASSIUM 100 MG PO TABS: 100.0000 mg | ORAL_TABLET | Freq: Every day | ORAL | 0 refills | Status: DC

## 2020-01-12 ENCOUNTER — Other Ambulatory Visit: Payer: Self-pay | Admitting: Family Medicine

## 2020-01-12 DIAGNOSIS — I10 Essential (primary) hypertension: Secondary | ICD-10-CM

## 2020-01-13 ENCOUNTER — Telehealth: Payer: Self-pay | Admitting: Family Medicine

## 2020-01-13 MED ORDER — LOSARTAN POTASSIUM 100 MG PO TABS: 100.0000 mg | ORAL_TABLET | Freq: Every day | ORAL | 0 refills | Status: DC

## 2020-01-13 NOTE — Addendum Note (Signed)
Addended by: Worthy Rancher B on: 01/13/2020 08:19 PM   Modules accepted: Orders

## 2020-01-13 NOTE — Telephone Encounter (Signed)
Error, added note to refill note from 01/08/20

## 2020-01-13 NOTE — Telephone Encounter (Signed)
Pt said he just left the [facility] pharmacy and they said they havent received anything, please advise

## 2020-01-14 MED ORDER — VALSARTAN 160 MG PO TABS: 160.0000 mg | ORAL_TABLET | Freq: Every day | ORAL | 1 refills | Status: AC

## 2020-01-14 NOTE — Telephone Encounter (Signed)
Rx sent for valsartan to pts pharm

## 2020-01-14 NOTE — Addendum Note (Signed)
Addended by: Overton Mam on: 01/14/2020 04:28 PM   Modules accepted: Orders

## 2020-01-14 NOTE — Telephone Encounter (Signed)
Please advise message below. Patients request for refill on Losartan denied due to medication being on back order at all pharmacies. Patient would like a different Rx sent in to replace Losartan.

## 2020-01-14 NOTE — Telephone Encounter (Signed)
Pt called stating [facility] received RX for losartan but he was advised of a backorder. They advised him to contact PCP office for change of medication. Pt wanting another inexpensive alternative sent in.  [facility] [address] [place] - [address] Phone:  [phone]  Fax:  [phone]

## 2020-01-14 NOTE — Telephone Encounter (Signed)
Patient aware Rx sent in  

## 2020-02-04 ENCOUNTER — Other Ambulatory Visit: Payer: Self-pay | Admitting: Family Medicine

## 2020-02-04 DIAGNOSIS — E876 Hypokalemia: Secondary | ICD-10-CM

## 2020-02-09 ENCOUNTER — Ambulatory Visit (INDEPENDENT_AMBULATORY_CARE_PROVIDER_SITE_OTHER): Payer: Medicare Other

## 2020-02-09 VITALS — Ht 73.0 in | Wt 263.0 lb

## 2020-02-09 DIAGNOSIS — Z0000 Encounter for general adult medical examination without abnormal findings: Secondary | ICD-10-CM | POA: Diagnosis not present

## 2020-02-09 NOTE — Patient Instructions (Signed)
Marcus Diaz , Thank you for taking time to complete your Medicare Wellness Visit. I appreciate your ongoing commitment to your health goals. Please review the following plan we discussed and let me know if I can assist you in the future.   Screening recommendations/referrals: Colonoscopy: Completed 12/08/2019-Due-12/07/2020 Recommended yearly ophthalmology/optometry visit for glaucoma screening and checkup Recommended yearly dental visit for hygiene and checkup  Vaccinations: Influenza vaccine: Up to date Pneumococcal vaccine: Not yet indicated-Due at age 44 Tdap vaccine: Up to date-Due-08/19/2028 Shingles vaccine: Discuss with pharmacy   Covid-19: Completed vaccines  Advanced directives: Information mailed today  Conditions/risks identified: See problem list  Next appointment: Follow up in one year for your annual wellness visit   Preventive Care 40-64 Years, Male Preventive care refers to lifestyle choices and visits with your health care provider that can promote health and wellness. What does preventive care include?  A yearly physical exam. This is also called an annual well check.  Dental exams once or twice a year.  Routine eye exams. Ask your health care provider how often you should have your eyes checked.  Personal lifestyle choices, including:  Daily care of your teeth and gums.  Regular physical activity.  Eating a healthy diet.  Avoiding tobacco and drug use.  Limiting alcohol use.  Practicing safe sex.  Taking low-dose aspirin every day starting at age 34. What happens during an annual well check? The services and screenings done by your health care provider during your annual well check will depend on your age, overall health, lifestyle risk factors, and family history of disease. Counseling  Your health care provider may ask you questions about your:  Alcohol use.  Tobacco use.  Drug use.  Emotional well-being.  Home and relationship  well-being.  Sexual activity.  Eating habits.  Work and work Astron[employer]. Screening  You may have the following tests or measurements:  Height, weight, and BMI.  Blood pressure.  Lipid and cholesterol levels. These may be checked every 5 years, or more frequently if you are over 98 years old.  Skin check.  Lung cancer screening. You may have this screening every year starting at age 90 if you have a 30-pack-year history of smoking and currently smoke or have quit within the past 15 years.  Fecal occult blood test (FOBT) of the stool. You may have this test every year starting at age 43.  Flexible sigmoidoscopy or colonoscopy. You may have a sigmoidoscopy every 5 years or a colonoscopy every 10 years starting at age 54.  Prostate cancer screening. Recommendations will vary depending on your family history and other risks.  Hepatitis C blood test.  Hepatitis B blood test.  Sexually transmitted disease (STD) testing.  Diabetes screening. This is done by checking your blood sugar (glucose) after you have not eaten for a while (fasting). You may have this done every 1-3 years. Discuss your test results, treatment options, and if necessary, the need for more tests with your health care provider. Vaccines  Your health care provider may recommend certain vaccines, such as:  Influenza vaccine. This is recommended every year.  Tetanus, diphtheria, and acellular pertussis (Tdap, Td) vaccine. You may need a Td booster every 10 years.  Zoster vaccine. You may need this after age 16.  Pneumococcal 13-valent conjugate (PCV13) vaccine. You may need this if you have certain conditions and have not been vaccinated.  Pneumococcal polysaccharide (PPSV23) vaccine. You may need one or two doses if you smoke cigarettes or if you  have certain conditions. Talk to your health care provider about which screenings and vaccines you need and how often you need them. This information is not intended  to replace advice given to you by your health care provider. Make sure you discuss any questions you have with your health care provider. Document Released: 01/14/2015 Document Revised: 09/07/2015 Document Reviewed: 10/19/2014 Elsevier Interactive Patient Education  2017 [place] Prevention in the Home Falls can cause injuries. They can happen to people of all ages. There are many things you can do to make your home safe and to help prevent falls. What can I do on the outside of my home?  Regularly fix the edges of walkways and driveways and fix any cracks.  Remove anything that might make you trip as you walk through a door, such as a raised step or threshold.  Trim any bushes or trees on the path to your home.  Use bright outdoor lighting.  Clear any walking paths of anything that might make someone trip, such as rocks or tools.  Regularly check to see if handrails are loose or broken. Make sure that both sides of any steps have handrails.  Any raised decks and porches should have guardrails on the edges.  Have any leaves, snow, or ice cleared regularly.  Use sand or salt on walking paths during winter.  Clean up any spills in your garage right away. This includes oil or grease spills. What can I do in the bathroom?  Use night lights.  Install grab bars by the toilet and in the tub and shower. Do not use towel bars as grab bars.  Use non-skid mats or decals in the tub or shower.  If you need to sit down in the shower, use a plastic, non-slip stool.  Keep the floor dry. Clean up any water that spills on the floor as soon as it happens.  Remove soap buildup in the tub or shower regularly.  Attach bath mats securely with double-sided non-slip rug tape.  Do not have throw rugs and other things on the floor that can make you trip. What can I do in the bedroom?  Use night lights.  Make sure that you have a light by your bed that is easy to reach.  Do not use  any sheets or blankets that are too big for your bed. They should not hang down onto the floor.  Have a firm chair that has side arms. You can use this for support while you get dressed.  Do not have throw rugs and other things on the floor that can make you trip. What can I do in the kitchen?  Clean up any spills right away.  Avoid walking on wet floors.  Keep items that you use a lot in easy-to-reach places.  If you need to reach something above you, use a strong step stool that has a grab bar.  Keep electrical cords out of the way.  Do not use floor polish or wax that makes floors slippery. If you must use wax, use non-skid floor wax.  Do not have throw rugs and other things on the floor that can make you trip. What can I do with my stairs?  Do not leave any items on the stairs.  Make sure that there are handrails on both sides of the stairs and use them. Fix handrails that are broken or loose. Make sure that handrails are as long as the stairways.  Check any carpeting  to make sure that it is firmly attached to the stairs. Fix any carpet that is loose or worn.  Avoid having throw rugs at the top or bottom of the stairs. If you do have throw rugs, attach them to the floor with carpet tape.  Make sure that you have a light switch at the top of the stairs and the bottom of the stairs. If you do not have them, ask someone to add them for you. What else can I do to help prevent falls?  Wear shoes that:  Do not have high heels.  Have rubber bottoms.  Are comfortable and fit you well.  Are closed at the toe. Do not wear sandals.  If you use a stepladder:  Make sure that it is fully opened. Do not climb a closed stepladder.  Make sure that both sides of the stepladder are locked into place.  Ask someone to hold it for you, if possible.  Clearly mark and make sure that you can see:  Any grab bars or handrails.  First and last steps.  Where the edge of each step  is.  Use tools that help you move around (mobility aids) if they are needed. These include:  Canes.  Walkers.  Scooters.  Crutches.  Turn on the lights when you go into a dark area. Replace any light bulbs as soon as they burn out.  Set up your furniture so you have a clear path. Avoid moving your furniture around.  If any of your floors are uneven, fix them.  If there are any pets around you, be aware of where they are.  Review your medicines with your doctor. Some medicines can make you feel dizzy. This can increase your chance of falling. Ask your doctor what other things that you can do to help prevent falls. This information is not intended to replace advice given to you by your health care provider. Make sure you discuss any questions you have with your health care provider. Document Released: 10/14/2008 Document Revised: 05/26/2015 Document Reviewed: 01/22/2014 Elsevier Interactive Patient Education  2017 [employer].

## 2020-02-09 NOTE — Progress Notes (Signed)
Subjective:   Marcus Diaz. is a 50 y.o. male who presents for an Initial Medicare Annual Wellness Visit.  I connected with Nicholis today by telephone and verified that I am speaking with the correct person using two identifiers. Location patient: home Location provider: work Persons participating in the virtual visit: patient, Engineer, civil (consulting).    I discussed the limitations, risks, security and privacy concerns of performing an evaluation and management service by telephone and the availability of in person appointments. I also discussed with the patient that there may be a patient responsible charge related to this service. The patient expressed understanding and verbally consented to this telephonic visit.    Interactive audio and video telecommunications were attempted between this provider and patient, however failed, due to patient having technical difficulties OR patient did not have access to video capability.  We continued and completed visit with audio only.  Some vital signs may be absent or patient reported.   Time Spent with patient on telephone encounter: 20 minutes   Review of Systems     Cardiac Risk Factors include: male gender;hypertension;dyslipidemia;obesity (BMI >30kg/m2)     Objective:    Today's Vitals   02/09/20 1545  Weight: 263 lb (119.3 kg)  Height: 6\' 1"  (1.854 m)   Body mass index is 34.7 kg/m.  Advanced Directives 02/09/2020  Does Patient Have a Medical Advance Directive? No  Would patient like information on creating a medical advance directive? Yes (MAU/Ambulatory/Procedural Areas - Information given)    Current Medications (verified) Outpatient Encounter Medications as of 02/09/2020  Medication Sig  . amLODipine (NORVASC) 5 MG tablet Take 1 tablet (5 mg total) by mouth daily.  04/08/2020 aspirin EC 81 MG tablet Take 1 tablet (81 mg total) by mouth daily. Swallow whole.  . chlorthalidone (HYGROTON) 25 MG tablet Take 1 tablet (25 mg total) by mouth daily.  Marland Kitchen  esomeprazole (NEXIUM) 20 MG capsule Take 20 mg by mouth daily at 12 noon.  Marland Kitchen esomeprazole (NEXIUM) 40 MG capsule Take 1 capsule (40 mg total) by mouth daily at 12 noon.  . naproxen sodium (ALEVE) 220 MG tablet Take 220 mg by mouth as needed.  . potassium chloride SA (KLOR-CON) 20 MEQ tablet Take 1 tablet by mouth once daily  . tadalafil (CIALIS) 20 MG tablet Take 0.5-1 tablets (10-20 mg total) by mouth every other day as needed for erectile dysfunction.  . valsartan (DIOVAN) 160 MG tablet Take 1 tablet (160 mg total) by mouth daily.  Marland Kitchen atorvastatin (LIPITOR) 10 MG tablet Take 1 tablet (10 mg total) by mouth daily.   Facility-Administered Encounter Medications as of 02/09/2020  Medication  . 0.9 %  sodium chloride infusion    Allergies (verified) Patient has no known allergies.   History: Past Medical History:  Diagnosis Date  . Backache 12/15/2009  . Bicuspid aortic valve 12/15/2009   Has seen dr. 12/17/2009, due for echocardiogram  Formatting of this note might be different from the original. Has seen dr. Rennis Harding, due for echocardiogram  . Cervical radiculopathy 12/12/2010   Formatting of this note might be different from the original. Surgery C4/5/6 dr. 14/11/2010 Oct 2012  . Dysfunction of left eustachian tube 05/25/2019  . Dysphagia 02/12/2013  . Elevated glucose 08/11/2019  . Elevated LFTs 08/11/2019  . Erectile dysfunction due to arterial insufficiency 04/20/2019  . Essential hypertension 12/15/2009   Cont with atenolol 50 mg daily bid  Lisinopril 20 mg daily     Last Assessment & Plan:  Bicuspid aortic valve will refer to cardiology/  Will check 24 hour holter as he has had some palpitations.  . Family history of diabetes mellitus 08/21/2010  . Foot pain, right 12/02/2015  . Gastritis 03/20/2013  . Gastroesophageal reflux disease without esophagitis 02/12/2013  . Heart murmur   . History of cardiovascular disorder 08/21/2010  . HTN (hypertension) 12/15/2009   Formatting of this note might be  different from the original.   Cont with atenolol 50 mg daily bid  Lisinopril 20 mg daily     Last Assessment & Plan:  Formatting of this note might be different from the original. Bicuspid aortic valve will refer to cardiology/  Will check 24 hour holter as he has had some palpitations.  . Hypercholesterolemia 12/15/2009   Cont to monitor with diet.  Formatting of this note might be different from the original. Cont to monitor with diet.  . Hyperglycemia 12/15/2009  . Hypertension   . Hypokalemia 08/13/2019  . Irregular Z line of esophagus 03/20/2013  . Joint pain of ankle and foot 12/16/2009  . Lumbar disc disease 02/13/2012  . MDD (major depressive disorder), recurrent, in full remission (HCC) 09/11/2012  . Medial epicondylitis, left 12/16/2009  . Morbid obesity (HCC) 06/25/2019  . Onychomycosis 10/18/2015  . 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 Preventative health care 12/16/2009  . Shoulder pain, right 12/15/2009  . Sleep apnea, obstructive 12/15/2009  . SOB (shortness of breath) 12/15/2009  . Testosterone deficiency 05/04/2010  . 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 ANKLE SURGERY Right    reconstructed  . neck fused    . SHOULDER SURGERY     X3  . ULNAR NERVE REPAIR     Family History  Problem Relation Age of Onset  . Lung disease Mother   . Cancer Father        esophageal cancer  . Cancer Maternal Grandfather        colon  . Cancer Paternal Grandmother        stomach   Social History   Socioeconomic History  . Marital status: Married    Spouse name: Not on file  . Number of children: Not on file  . Years of education: Not on file  . Highest education level: Not on file  Occupational History  . Not on file  Tobacco Use  . Smoking status: Never Smoker  . Smokeless tobacco: Never Used  Vaping Use  . Vaping Use: Never used  Substance and Sexual Activity  . Alcohol use: Yes    Comment: rarely   . Drug use: Never  . Sexual activity: Not on file  Other Topics Concern  . Not on file  Social History Narrative  . Not on file   Social Determinants of Health   Financial Resource Strain: Low Risk   . Difficulty of Paying Living Expenses: Not hard at all  Food Insecurity: No Food Insecurity  . Worried About Programme researcher, broadcasting/film/video in the Last Year: Never true  . Ran Out of Food in the Last Year: Never true  Transportation Needs: No Transportation Needs  . Lack of Transportation (Medical): No  . Lack of Transportation (Non-Medical): No  Physical Activity: Sufficiently Active  . Days of Exercise per Week: 4 days  . Minutes of Exercise per Session: 60 min  Stress: No Stress Concern Present  . Feeling of Stress : Not at all  Social Connections: Moderately Isolated  .  Frequency of Communication with Friends and Family: More than three times a week  . Frequency of Social Gatherings with Friends and Family: More than three times a week  . Attends Religious Services: Never  . Active Member of Clubs or Organizations: No  . Attends Banker Meetings: Never  . Marital Status: Married    Tobacco Counseling Counseling given: Not Answered   Clinical Intake:  Pre-visit preparation completed: Yes  Pain : No/denies pain     Nutritional Status: BMI > 30  Obese Nutritional Risks: None Diabetes: No  How often do you need to have someone help you when you read instructions, pamphlets, or other written materials from your doctor or pharmacy?: 1 - Never  Diabetic?No  Interpreter Needed?: No  Information entered by :: Thomasenia Sales LPN   Activities of Daily Living In your present state of health, do you have any difficulty performing the following activities: 02/09/2020  Hearing? Y  Comment hearing loss left ear  Vision? N  Difficulty concentrating or making decisions? N  Walking or climbing stairs? N  Dressing or bathing? N  Doing errands, shopping? N  Preparing  Food and eating ? N  Using the Toilet? N  In the past six months, have you accidently leaked urine? N  Do you have problems with loss of bowel control? N  Managing your Medications? N  Managing your Finances? N  Housekeeping or managing your Housekeeping? N  Some recent data might be hidden    Patient Care Team: Mliss Sax, MD as PCP - General (Family Medicine) Janalyn Harder, MD as Consulting Physician (Dermatology)  Indicate any recent Medical Services you may have received from other than [facility] providers in the past year (date may be approximate).     Assessment:   This is a routine wellness examination for Brance.  Hearing/Vision screen  Hearing Screening   125Hz  250Hz  500Hz  1000Hz  2000Hz  3000Hz  4000Hz  6000Hz  8000Hz   Right ear:           Left ear:           Comments: Some hearing loss in left ear  Vision Screening Comments: Wears glasses Last eye exam-2020  Dietary issues and exercise activities discussed: Current Exercise Habits: Home exercise routine, Type of exercise: Other - see comments (biking), Time (Minutes): 60, Frequency (Times/Week): 4, Weekly Exercise (Minutes/Week): 240, Intensity: Mild, Exercise limited by: None identified  Goals    . Patient Stated     Eat healther, drink more water & increase activity      Depression Screen PHQ 2/9 Scores 02/09/2020 08/20/2018  PHQ - 2 Score 0 0    Fall Risk Fall Risk  02/09/2020  Falls in the past year? 0  Number falls in past yr: 0  Injury with Fall? 0  Follow up Falls prevention discussed    FALL RISK PREVENTION PERTAINING TO THE HOME:  Any stairs in or around the home? Yes  If so, are there any without handrails? No  Home free of loose throw rugs in walkways, pet beds, electrical cords, etc? Yes  Adequate lighting in your home to reduce risk of falls? Yes   ASSISTIVE DEVICES UTILIZED TO PREVENT FALLS:  Life alert? No  Use of a cane, walker or w/c? No  Grab bars in the bathroom? No  Shower chair  or bench in shower? No  Elevated toilet seat or a handicapped toilet? No   TIMED UP AND GO:  Was the test performed? No . Phone  visit   Cognitive Function:Normal cognitive status assessed by  this Nurse Health Advisor. No abnormalities found.          Immunizations Immunization History  Administered Date(s) Administered  . Influenza Inj Mdck Quad Pf 10/04/2011  . Influenza, Seasonal, Injecte, Preservative Fre 02/13/2018  . Influenza,inj,Quad PF,6+ Mos 08/20/2018  . Influenza,inj,quad, With Preservative 10/02/2015  . Influenza-Unspecified 12/14/2009, 09/12/2010, 09/18/2012, 09/22/2013, 10/18/2015, 09/16/2019  . PFIZER(Purple Top)SARS-COV-2 Vaccination 04/02/2019, 04/27/2019, 12/03/2019  . Tdap 02/14/2009, 08/20/2018    TDAP status: Up to date  Flu Vaccine status: Up to date  Pneumococcal vaccine status: Not yet indicated  Covid-19 vaccine status: Completed vaccines  Qualifies for Shingles Vaccine? Yes   Zostavax completed No   Shingrix Completed?: No.    Education has been provided regarding the importance of this vaccine. Patient has been advised to call insurance company to determine out of pocket expense if they have not yet received this vaccine. Advised may also receive vaccine at local pharmacy or Health Dept. Verbalized acceptance and understanding.  Screening Tests Health Maintenance  Topic Date Due  . TETANUS/TDAP  08/19/2028  . COLONOSCOPY (Pts 45-74yrs Insurance coverage will need to be confirmed)  12/07/2029  . INFLUENZA VACCINE  Completed  . COVID-19 Vaccine  Completed  . Hepatitis C Screening  Completed  . HIV Screening  Completed    Health Maintenance  There are no preventive care reminders to display for this patient.  Colorectal cancer screening: Type of screening: Colonoscopy. Completed 12/08/2019. Repeat every 10 years  Lung Cancer Screening: (Low Dose CT Chest recommended if Age 41-80 years, 30 pack-year currently smoking OR have quit w/in  15years.) does not qualify.    Additional Screening:  Hepatitis C Screening: Completed 08/11/2019  Vision Screening: Recommended annual ophthalmology exams for early detection of glaucoma and other disorders of the eye. Is the patient up to date with their annual eye exam?  No  Who is the provider or what is the name of the office in which the patient attends annual eye exams? Unknown Patient  to schedule an appt. soon  Dental Screening: Recommended annual dental exams for proper oral hygiene  Community Resource Referral / Chronic Care Management: CRR required this visit?  No   CCM required this visit?  No      Plan:     I have personally reviewed and noted the following in the patient's chart:   . Medical and social history . Use of alcohol, tobacco or illicit drugs  . Current medications and supplements . Functional ability and status . Nutritional status . Physical activity . Advanced directives . List of other physicians . Hospitalizations, surgeries, and ER visits in previous 12 months . Vitals . Screenings to include cognitive, depression, and falls . Referrals and appointments  In addition, I have reviewed and discussed with patient certain preventive protocols, quality metrics, and best practice recommendations. A written personalized care plan for preventive services as well as general preventive health recommendations were provided to patient.   Due to this being a telephonic visit, the after visit summary with patients personalized plan was offered to patient via mail or my-chart. Patient would like to access on my-chart.    Roanna Raider, LPN   01/03/2438  Nurse Health Advisor  Nurse Notes: None

## 2020-02-11 ENCOUNTER — Ambulatory Visit: Payer: Medicare Other | Admitting: Family Medicine

## 2020-03-15 ENCOUNTER — Ambulatory Visit: Payer: Medicare Other | Admitting: Cardiology

## 2020-03-22 ENCOUNTER — Ambulatory Visit: Payer: Medicare Other | Admitting: Family Medicine

## 2020-03-25 ENCOUNTER — Encounter: Payer: Self-pay | Admitting: Family Medicine

## 2020-03-25 DIAGNOSIS — M25559 Pain in unspecified hip: Secondary | ICD-10-CM

## 2020-04-12 DIAGNOSIS — M25551 Pain in right hip: Secondary | ICD-10-CM | POA: Diagnosis not present

## 2020-04-12 DIAGNOSIS — M51369 Other intervertebral disc degeneration, lumbar region without mention of lumbar back pain or lower extremity pain: Secondary | ICD-10-CM

## 2020-04-12 DIAGNOSIS — M5136 Other intervertebral disc degeneration, lumbar region: Secondary | ICD-10-CM | POA: Diagnosis not present

## 2020-04-12 DIAGNOSIS — M5431 Sciatica, right side: Secondary | ICD-10-CM | POA: Diagnosis not present

## 2020-04-12 HISTORY — DX: Other intervertebral disc degeneration, lumbar region without mention of lumbar back pain or lower extremity pain: M51.369

## 2020-04-12 HISTORY — DX: Other intervertebral disc degeneration, lumbar region: M51.36

## 2020-04-13 DIAGNOSIS — K219 Gastro-esophageal reflux disease without esophagitis: Secondary | ICD-10-CM | POA: Diagnosis not present

## 2020-04-13 DIAGNOSIS — N529 Male erectile dysfunction, unspecified: Secondary | ICD-10-CM | POA: Diagnosis not present

## 2020-04-13 DIAGNOSIS — M5412 Radiculopathy, cervical region: Secondary | ICD-10-CM | POA: Diagnosis not present

## 2020-04-13 DIAGNOSIS — I10 Essential (primary) hypertension: Secondary | ICD-10-CM | POA: Diagnosis not present

## 2020-04-27 ENCOUNTER — Other Ambulatory Visit: Payer: Self-pay

## 2020-04-27 DIAGNOSIS — I10 Essential (primary) hypertension: Secondary | ICD-10-CM | POA: Insufficient documentation

## 2020-05-05 ENCOUNTER — Ambulatory Visit: Payer: Medicare Other | Admitting: Cardiology

## 2021-08-21 IMAGING — CT CT CHEST W/O CM
2 of 4 series · 15 of 36 positions shown, 18 images · non-contrast
Comparison: No priors.

CLINICAL DATA: 49-year-old male with history of bicuspid aortic
valve.

EXAM:
CT CHEST WITHOUT CONTRAST
TECHNIQUE: Multidetector CT imaging of the chest was performed following the
standard protocol without IV contrast.

[Series 2: thorax · axial · 0.76mm/px · z∈[-336,-36]mm · 12 of 178 slices shown, 15 images]
[im 14/178  mediastinal]
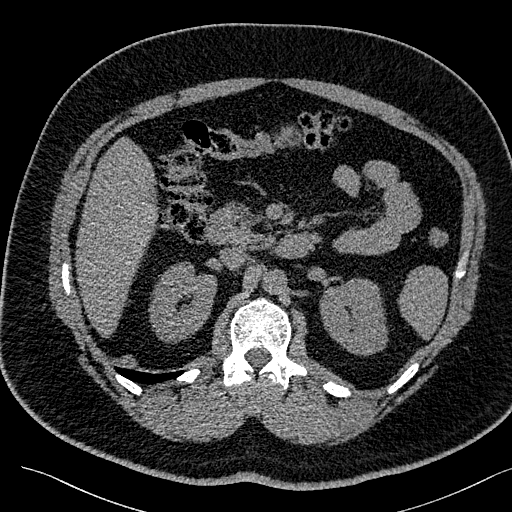
[im 14/178  lung]
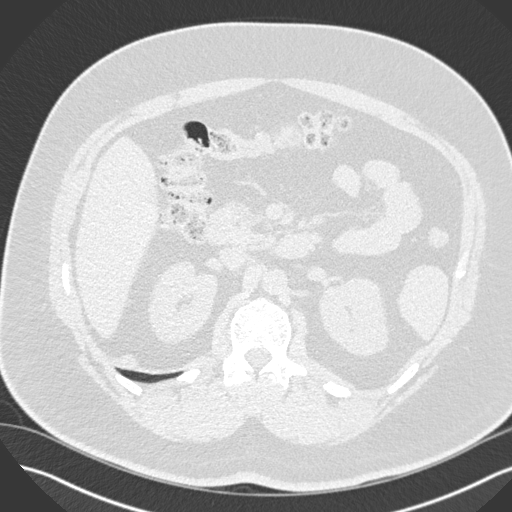
[im 28/178  lung]
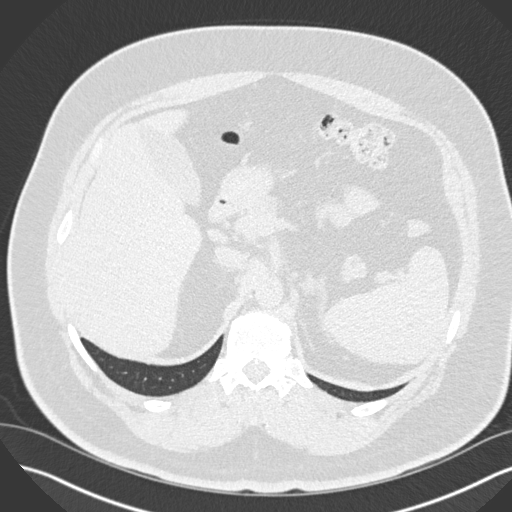
[im 41/178  lung]
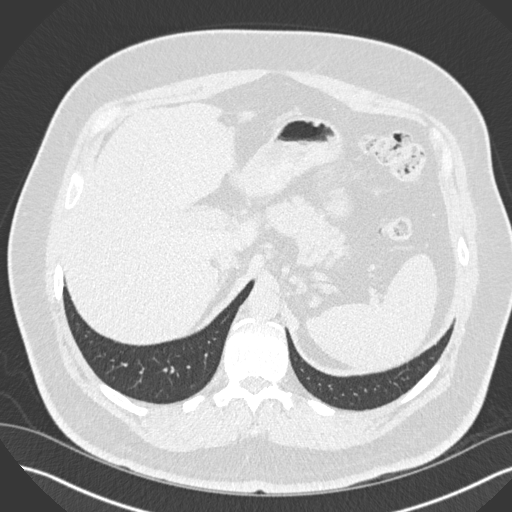
[im 55/178  lung]
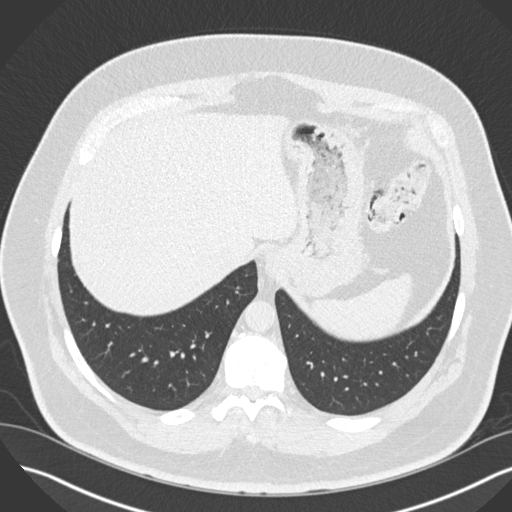
[im 69/178  mediastinal]
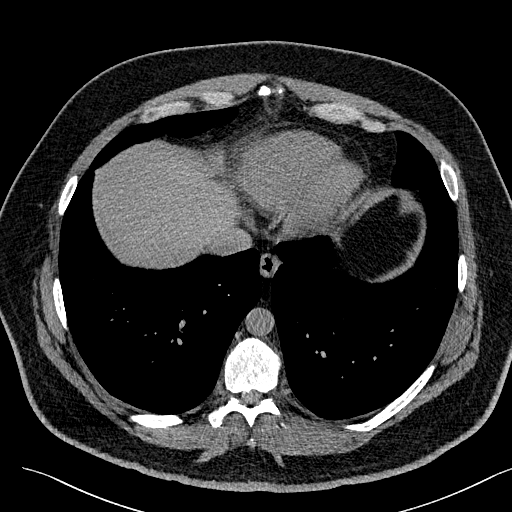
[im 69/178  lung]
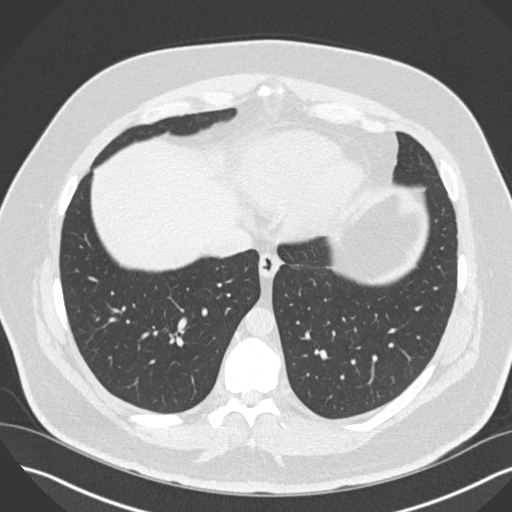
[im 82/178  lung]
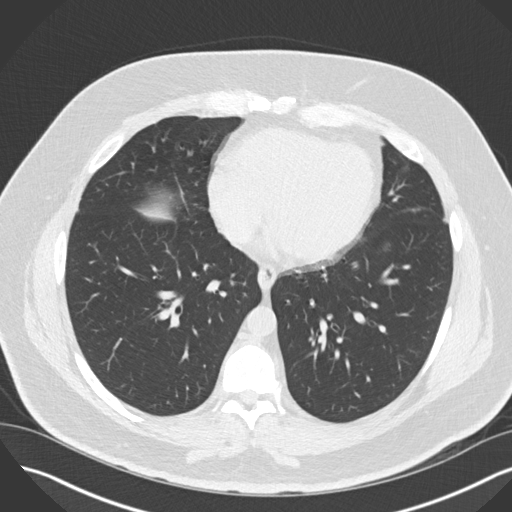
[im 96/178  lung]
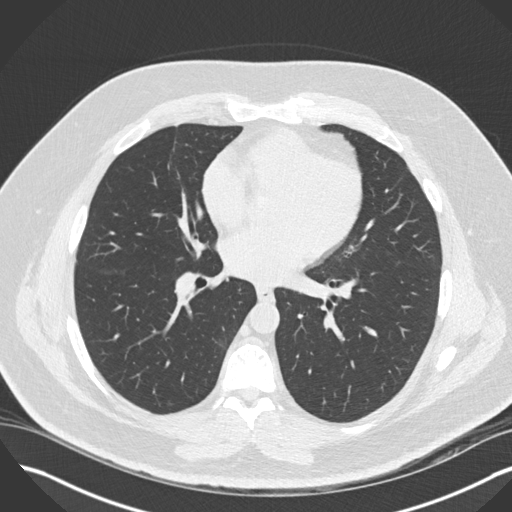
[im 109/178  lung]
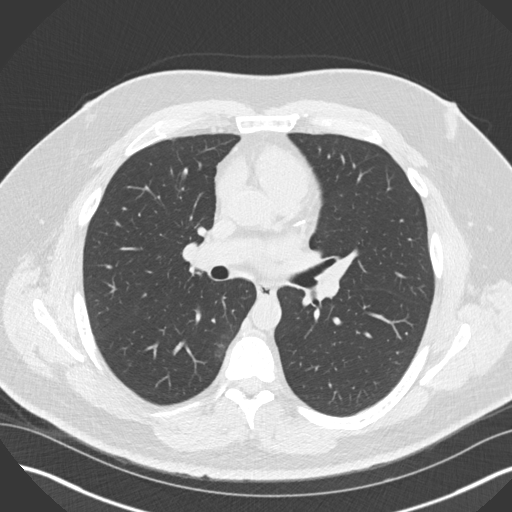
[im 123/178  mediastinal]
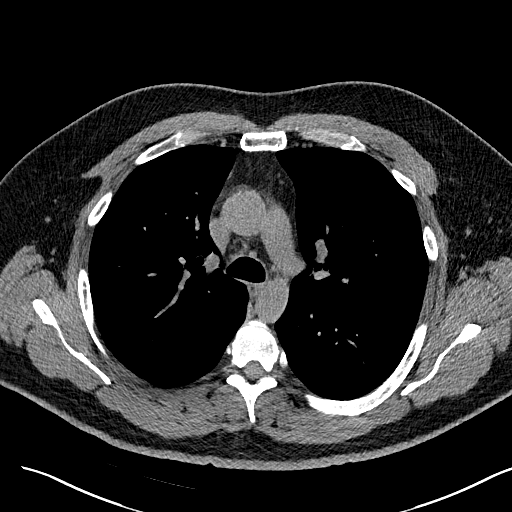
[im 123/178  lung]
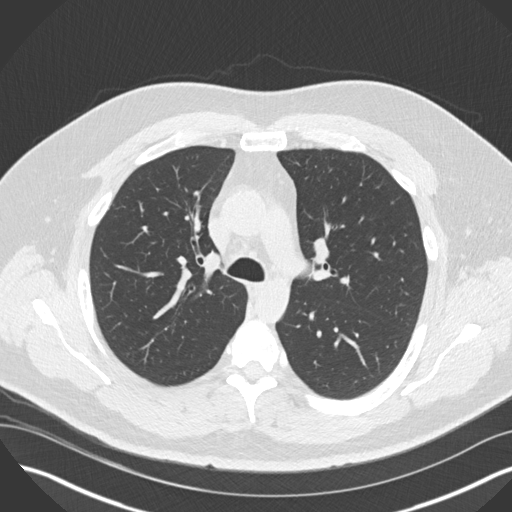
[im 137/178  lung]
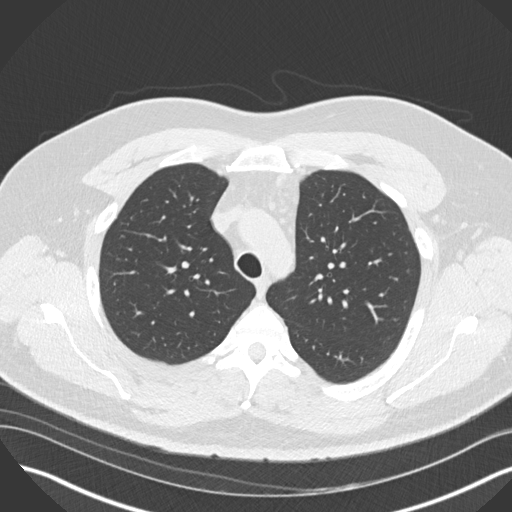
[im 150/178  lung]
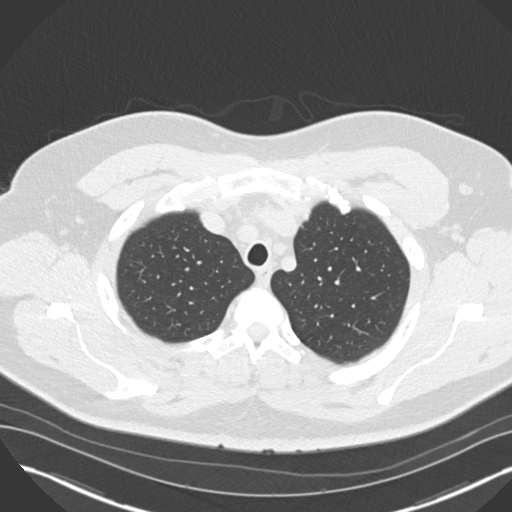
[im 164/178  lung]
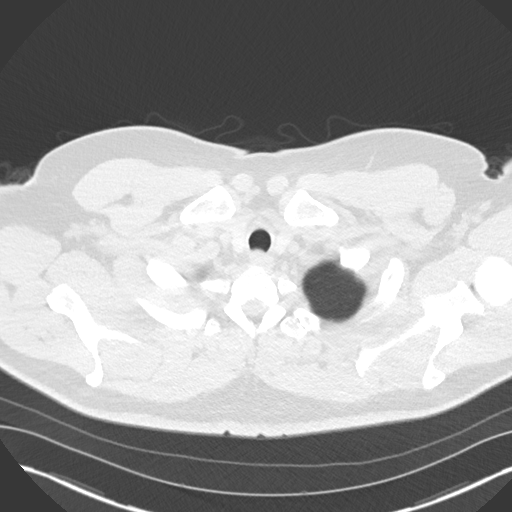

[Series 5: coronal · coronal · 0.69mm/px · 3 of 150 slices shown]
[im 30/150  lung]
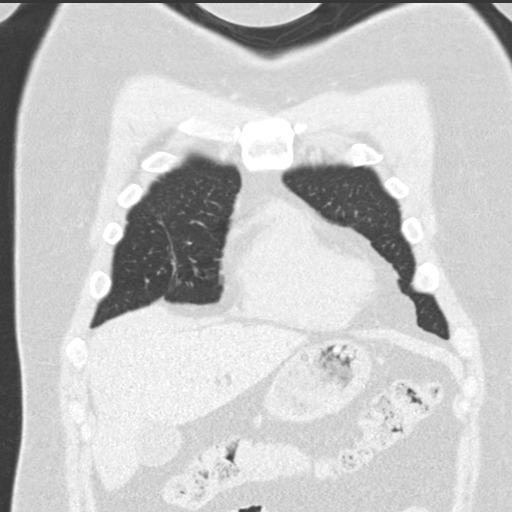
[im 60/150  lung]
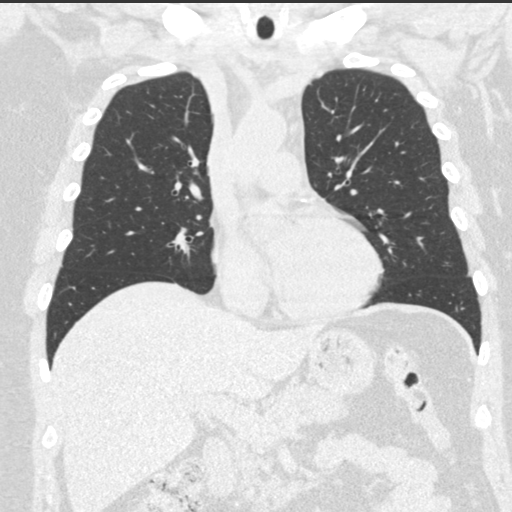
[im 90/150  lung]
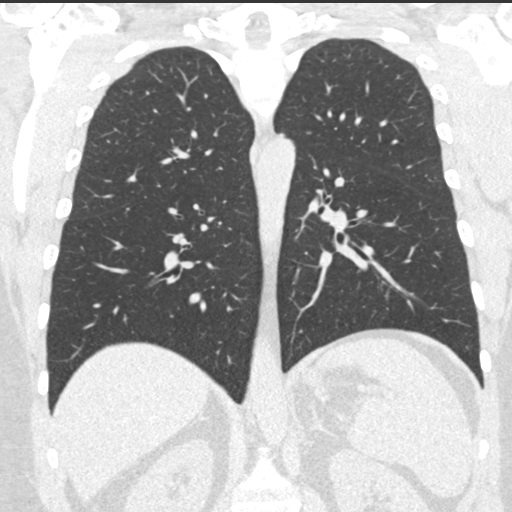

[15 of 36 positions shown; findings below may reference images not displayed]

FINDINGS: Cardiovascular: Heart size is normal. There is no significant
pericardial fluid, thickening or pericardial calcification. There is
aortic atherosclerosis, as well as atherosclerosis of the great
vessels of the mediastinum and the coronary arteries, including
calcified atherosclerotic plaque in the ramus intermedius and right
coronary arteries. Ascending thoracic aorta measures 3.2 cm in
diameter. Mid aortic arch measures 2.8 cm in diameter. Descending
thoracic aorta measures 2.3 cm in diameter.

Mediastinum/Nodes: No pathologically enlarged mediastinal or hilar
lymph nodes. Please note that accurate exclusion of hilar adenopathy
is limited on noncontrast CT scans. Esophagus is unremarkable in
appearance. No axillary lymphadenopathy.

Lungs/Pleura: No acute consolidative airspace disease. No pleural
effusions. No suspicious appearing pulmonary nodules or masses are
noted.

Upper Abdomen: Unremarkable.

Musculoskeletal: Orthopedic fixation hardware in the lower cervical
spine incidentally noted. There are no aggressive appearing lytic or
blastic lesions noted in the visualized portions of the skeleton.
IMPRESSION: 1. No aneurysm of the thoracic aorta.
2. Aortic atherosclerosis, in addition to 2 vessel coronary artery
disease. Please note that although the presence of coronary artery
calcium documents the presence of coronary artery disease, the
severity of this disease and any potential stenosis cannot be
assessed on this non-gated CT examination. Assessment for potential
risk factor modification, dietary therapy or pharmacologic therapy
may be warranted, if clinically indicated.

Aortic Atherosclerosis (H2DB9-RCL.L).

## 2022-04-26 ENCOUNTER — Telehealth: Payer: Self-pay

## 2022-04-26 NOTE — Transitions of Care (Post Inpatient/ED Visit) (Signed)
   04/26/2022  Name: Cristy Hilts. MRN: [id number] DOB: 29-Apr-1970  Today's TOC FU Call Status: Today's TOC FU Call Status:: Successful TOC FU Call Competed TOC FU Call Complete Date: 04/26/22  Transition Care Management Follow-up Telephone Call Date of Discharge: 04/25/22 Discharge Facility: Other [facility]) Name of Other (Non[facility]) Discharge Facility: [place] Type of Discharge: Inpatient Admission Primary Inpatient Discharge Diagnosis:: arthrodesis How have you been since you were released from the hospital?: Better Any questions or concerns?: No  Items Reviewed: Did you receive and understand the discharge instructions provided?: Yes Medications obtained and verified?: Yes (Medications Reviewed) Any new allergies since your discharge?: No Dietary orders reviewed?: Yes Do you have support at home?: Yes People in Home: spouse  Home Care and Equipment/Supplies: Were Home Health Services Ordered?: NA Any new equipment or medical supplies ordered?: NA  Functional Questionnaire: Do you need assistance with bathing/showering or dressing?: Yes Do you need assistance with meal preparation?: No Do you need assistance with eating?: No Do you have difficulty maintaining continence: No Do you need assistance with getting out of bed/getting out of a chair/moving?: Yes Do you have difficulty managing or taking your medications?: No  Follow up appointments reviewed: PCP Follow-up appointment confirmed?: No (patient transferred to [facility])    SIGNATURE Karena Addison, LPN [facility] Nurse Health Advisor Direct Dial [phone]
